# Patient Record
Sex: Male | Born: 1990 | Race: White | Hispanic: No | Marital: Married | Smoking: Never smoker
Health system: Southern US, Community
[De-identification: ages and names within clinical notes are randomized; demographics above are authoritative.]

## PROBLEM LIST (undated history)

## (undated) DIAGNOSIS — Z8679 Personal history of other diseases of the circulatory system: Secondary | ICD-10-CM

## (undated) DIAGNOSIS — Z8639 Personal history of other endocrine, nutritional and metabolic disease: Secondary | ICD-10-CM

## (undated) HISTORY — DX: Personal history of other diseases of the circulatory system: Z86.79

## (undated) HISTORY — DX: Personal history of other endocrine, nutritional and metabolic disease: Z86.39

---

## 2014-10-10 ENCOUNTER — Ambulatory Visit (INDEPENDENT_AMBULATORY_CARE_PROVIDER_SITE_OTHER): Payer: PRIVATE HEALTH INSURANCE | Admitting: Physician Assistant

## 2014-10-10 VITALS — BP 116/76 | HR 73 | Temp 97.3°F | Resp 17 | Ht 75.5 in | Wt 190.4 lb

## 2014-10-10 DIAGNOSIS — J309 Allergic rhinitis, unspecified: Secondary | ICD-10-CM

## 2014-10-10 DIAGNOSIS — R05 Cough: Secondary | ICD-10-CM | POA: Diagnosis not present

## 2014-10-10 DIAGNOSIS — R0981 Nasal congestion: Secondary | ICD-10-CM | POA: Diagnosis not present

## 2014-10-10 DIAGNOSIS — R059 Cough, unspecified: Secondary | ICD-10-CM

## 2014-10-10 MED ORDER — AMOXICILLIN-POT CLAVULANATE 875-125 MG PO TABS: 1.0000 | ORAL_TABLET | Freq: Two times a day (BID) | ORAL | Status: DC

## 2014-10-10 MED ORDER — HYDROCODONE-HOMATROPINE 5-1.5 MG/5ML PO SYRP
5.0000 mL | ORAL_SOLUTION | Freq: Three times a day (TID) | ORAL | Status: DC | PRN
Start: 2014-10-10 — End: 2017-09-11

## 2014-10-10 NOTE — Patient Instructions (Signed)
Since your symptoms have been ongoing for weeks and you continue to have the productive cough and runny nose we will treat you with antibiotics.  Please take the augmentin twice daily for 10 days.  Continue to take the mucinex-d as needed. If allergies are playing a component, daily flonase and claritin may help.  Please return to clinic if you're not feeling better in 5-7 days.  For the cough, please take the hycodan as needed.

## 2014-10-10 NOTE — Progress Notes (Signed)
   Subjective:    Patient ID: Logan Mcguire, male    DOB: 12/30/1990, 24 y.o.   MRN: [id number]  Chief Complaint  Patient presents with  . Coughing    Productive x 2 weeks  . Nasal Congestion    x 2 weeks   There are no active problems to display for this patient.  Prior to Admission medications   Medication Sig Start Date End Date Taking? Authorizing Provider  amoxicillin-clavulanate (AUGMENTIN) 875-125 MG per tablet Take 1 tablet by mouth 2 (two) times daily. 10/10/14   Raelyn Ensignodd Chassie Pennix, PA  HYDROcodone-homatropine [facility](HYCODAN) 5-1.5 MG/5ML syrup Take 5 mLs by mouth every 8 (eight) hours as needed for cough. 10/10/14   Raelyn Ensignodd Marquavious Nazar, PA  pseudoephedrine-guaifenesin (MUCINEX D) 60-600 MG per tablet Take 1 tablet by mouth every 12 (twelve) hours.   Yes Historical Provider, MD   Medications, allergies, past medical history, surgical history, family history, social history and problem list reviewed and updated.  HPI  [age] yom with no significant pmh presents with sinus congestion.  Sx started three wks ago with nasal/ear congestion. Had mild rhinorrhea. Approx one week ago sx worsened, rhinorrhea became more excessive and got yellow/green color. Has had mildly prod cough past week. Denies fever/chills.   Denies assoc st, abd pain, n/v, diarrhea.    Has hx seasonal allergies.   Review of Systems No cp, sob.     Objective:   Physical Exam  Constitutional: He appears well-developed and well-nourished.  Non-toxic appearance. He does not have a sickly appearance. He does not appear ill. No distress.  BP 116/76 mmHg  Pulse 73  Temp(Src) 97.3 F (36.3 C) (Oral)  Resp 17  Ht 6' 3.5" (1.918 m)  Wt 190 lb 6.4 oz (86.365 kg)  BMI 23.48 kg/m2  SpO2 99%   HENT:  Right Ear: A middle ear effusion is present.  Left Ear: A middle ear effusion is present.  Nose: Mucosal edema and rhinorrhea present. Right sinus exhibits frontal sinus tenderness. Right sinus exhibits no maxillary sinus tenderness. Left  sinus exhibits frontal sinus tenderness. Left sinus exhibits no maxillary sinus tenderness.  Mouth/Throat: Uvula is midline, oropharynx is clear and moist and mucous membranes are normal. No oropharyngeal exudate, posterior oropharyngeal edema, posterior oropharyngeal erythema or tonsillar abscesses.  Mild-mod bilateral frontal ttp.   Cardiovascular: Normal rate, regular rhythm and normal heart sounds.  Exam reveals no gallop.   No murmur heard. Pulmonary/Chest: Effort normal and breath sounds normal. No tachypnea. He has no decreased breath sounds. He has no wheezes. He has no rhonchi. He has no rales.  Lymphadenopathy:       Head (right side): No submental, no submandibular and no tonsillar adenopathy present.       Head (left side): No submental, no submandibular and no tonsillar adenopathy present.    He has no cervical adenopathy.      Assessment & Plan:   [age] yom with no significant pmh presents with sinus congestion.  Sinus congestion - Plan: amoxicillin-clavulanate (AUGMENTIN) 875-125 MG per tablet Cough - Plan: HYDROcodone-homatropine (HYCODAN) 5-1.5 MG/5ML syrup Allergic rhinitis, unspecified allergic rhinitis type --possibly bacterial etiology at this time as sx worsened last week --augmentin 10 days --mucinex-d prn, fluids/rest --claritin, flonase for allergies --rtc if not improved 5-7 days --hycodan prn for cough  Donnajean Lopesodd M. Stevana Dufner, PA-C Physician Assistant-Certified [facility]  10/10/2014 1:54 PM

## 2015-02-26 ENCOUNTER — Encounter: Payer: Self-pay | Admitting: *Deleted

## 2015-02-26 ENCOUNTER — Emergency Department
Admission: EM | Admit: 2015-02-26 | Discharge: 2015-02-27 | Disposition: A | Discharge status: Home / Self Care | Payer: PRIVATE HEALTH INSURANCE | Attending: Emergency Medicine | Admitting: Emergency Medicine

## 2015-02-26 DIAGNOSIS — W2209XA Striking against other stationary object, initial encounter: Secondary | ICD-10-CM | POA: Diagnosis not present

## 2015-02-26 DIAGNOSIS — Y998 Other external cause status: Secondary | ICD-10-CM | POA: Insufficient documentation

## 2015-02-26 DIAGNOSIS — Y9289 Other specified places as the place of occurrence of the external cause: Secondary | ICD-10-CM | POA: Insufficient documentation

## 2015-02-26 DIAGNOSIS — S81811A Laceration without foreign body, right lower leg, initial encounter: Secondary | ICD-10-CM | POA: Diagnosis present

## 2015-02-26 DIAGNOSIS — Z79899 Other long term (current) drug therapy: Secondary | ICD-10-CM | POA: Diagnosis not present

## 2015-02-26 DIAGNOSIS — IMO0002 Reserved for concepts with insufficient information to code with codable children: Secondary | ICD-10-CM

## 2015-02-26 DIAGNOSIS — Y9389 Activity, other specified: Secondary | ICD-10-CM | POA: Diagnosis not present

## 2015-02-26 MED ORDER — LIDOCAINE-EPINEPHRINE (PF) 1 %-1:200000 IJ SOLN
INTRAMUSCULAR | Status: AC
  Filled 2015-02-26: qty 30

## 2015-02-26 MED ORDER — LIDOCAINE-EPINEPHRINE (PF) 2 %-1:200000 IJ SOLN
10.0000 mL | Freq: Once | INTRAMUSCULAR | Status: AC
  Administered 2015-02-27: 10 mL via INTRADERMAL
  Filled 2015-02-26: qty 10

## 2015-02-26 NOTE — ED Notes (Signed)
Pt cut his left leg on the ledge of a bench in the pool around 11:30 this morning. Pt applied steri strips to the wound, reports still having bleeding to the area, wants further evaluation. Small amount of oozing noted in triage.

## 2015-02-27 MED ORDER — CEPHALEXIN 500 MG PO CAPS: 500.0000 mg | ORAL_CAPSULE | Freq: Three times a day (TID) | ORAL | Status: AC

## 2015-02-27 NOTE — Discharge Instructions (Signed)
Laceration Care, Adult A laceration is a cut that goes through all layers of the skin. The cut goes into the tissue beneath the skin. HOME CARE For stitches (sutures) or staples:  Keep the cut clean and dry.  If you have a bandage (dressing), change it at least once a day. Change the bandage if it gets wet or dirty, or as told by your doctor.  Wash the cut with soap and water 2 times a day. Rinse the cut with water. Pat it dry with a clean towel.  Put a thin layer of medicated cream on the cut as told by your doctor.  You may shower after the first 24 hours. Do not soak the cut in water until the stitches are removed.  Only take medicines as told by your doctor.  Have your stitches or staples removed as told by your doctor. For skin adhesive strips:  Keep the cut clean and dry.  Do not get the strips wet. You may take a bath, but be careful to keep the cut dry.  If the cut gets wet, pat it dry with a clean towel.  The strips will fall off on their own. Do not remove the strips that are still stuck to the cut. For wound glue:  You may shower or take baths. Do not soak or scrub the cut. Do not swim. Avoid heavy sweating until the glue falls off on its own. After a shower or bath, pat the cut dry with a clean towel.  Do not put medicine on your cut until the glue falls off.  If you have a bandage, do not put tape over the glue.  Avoid lots of sunlight or tanning lamps until the glue falls off. Put sunscreen on the cut for the first year to reduce your scar.  The glue will fall off on its own. Do not pick at the glue. You may need a tetanus shot if:  You cannot remember when you had your last tetanus shot.  You have never had a tetanus shot. If you need a tetanus shot and you choose not to have one, you may get tetanus. Sickness from tetanus can be serious. GET HELP RIGHT AWAY IF:   Your pain does not get better with medicine.  Your arm, hand, leg, or foot loses feeling  (numbness) or changes color.  Your cut is bleeding.  Your joint feels weak, or you cannot use your joint.  You have painful lumps on your body.  Your cut is red, puffy (swollen), or painful.  You have a red line on the skin near the cut.  You have yellowish-white fluid (pus) coming from the cut.  You have a fever.  You have a bad smell coming from the cut or bandage.  Your cut breaks open before or after stitches are removed.  You notice something coming out of the cut, such as wood or glass.  You cannot move a finger or toe. MAKE SURE YOU:   Understand these instructions.  Will watch your condition.  Will get help right away if you are not doing well or get worse. Document Released: 01/18/2008 Document Revised: 10/24/2011 Document Reviewed: 01/25/2011 [facility] Patient Information 2015 [employer] [place]. This information is not intended to replace advice given to you by your health care provider. Make sure you discuss any questions you have with your health care provider.  Take antibiotic as directed. Watch for signs of infection. Return in about 10-12 days for suture removal.

## 2015-02-27 NOTE — ED Provider Notes (Signed)
[facility] Emergency Department Provider Note  ____________________________________________  Time seen: Approximately 12:09 AM  I have reviewed the triage vital signs and the nursing notes.   HISTORY  Chief Complaint Extremity Laceration    HPI Logan Mcguire is a 24 y.o. male who injured his left shin approximately 10-12 hours ago. He hit a bench. Also injured his right shin. Attempted closure with butterfly strips. However area continued to bleed. He reports a tetanus shot within the last 5 years.   History reviewed. No pertinent past medical history.  There are no active problems to display for this patient.   History reviewed. No pertinent past surgical history.  Current Outpatient Rx  Name  Route  Sig  Dispense  Refill  . cephALEXin (KEFLEX) 500 MG capsule   Oral   Take 1 capsule (500 mg total) by mouth 3 (three) times daily.   9 capsule   0   . HYDROcodone-homatropine (HYCODAN) 5-1.5 MG/5ML syrup   Oral   Take 5 mLs by mouth every 8 (eight) hours as needed for cough.   120 mL   0   . pseudoephedrine-guaifenesin (MUCINEX D) 60-600 MG per tablet   Oral   Take 1 tablet by mouth every 12 (twelve) hours.           Allergies Review of patient's allergies indicates no known allergies.  No family history on file.  Social History History  Substance Use Topics  . Smoking status: Never Smoker   . Smokeless tobacco: Not on file  . Alcohol Use: 0.0 oz/week    0 Standard drinks or equivalent per week    Review of Systems Constitutional: No fever/chills Eyes: No visual changes. ENT: No sore throat. Cardiovascular: Denies chest pain. Respiratory: Denies shortness of breath. Gastrointestinal: No abdominal pain.  No nausea, no vomiting.  No diarrhea.  No constipation. Genitourinary: Negative for dysuria. Musculoskeletal: Negative for back pain. Skin: Negative for rash. Neurological: Negative for headaches, focal weakness or  numbness.  10-point ROS otherwise negative.  ____________________________________________   PHYSICAL EXAM:  VITAL SIGNS: ED Triage Vitals  Enc Vitals Group     BP 02/26/15 2245 136/49 mmHg     Pulse Rate 02/26/15 2245 89     Resp 02/26/15 2245 16     Temp 02/26/15 2245 97.6 F (36.4 C)     Temp Source 02/26/15 2245 Oral     SpO2 02/26/15 2245 100 %     Weight 02/26/15 2245 190 lb (86.183 kg)     Height 02/26/15 2245 6\' 4"  (1.93 m)     Head Cir --      Peak Flow --      Pain Score 02/26/15 2246 3     Pain Loc --      Pain Edu? --      Excl. in GC? --     Constitutional: Alert and oriented. Well appearing and in no acute distress. Eyes: Conjunctivae are normal. PERRL. EOMI. Head: Atraumatic. Nose: No congestion/rhinnorhea. Mouth/Throat: Mucous membranes are moist.  Oropharynx non-erythematous. Cardiovascular: Normal rate, regular rhythm. Grossly normal heart sounds.  Good peripheral circulation. Respiratory: Normal respiratory effort.  No retractions. Lungs CTAB. Gastrointestinal: Soft and nontender. No distention. No abdominal bruits. No CVA tenderness. Musculoskeletal: No lower extremity tenderness nor edema.  No joint effusions. Neurologic:  Normal speech and language. No gross focal neurologic deficits are appreciated. No gait instability. Skin:  Skin is warm, dry and intact. No rash noted. Psychiatric: Mood and affect are  normal. Speech and behavior are normal.  ____________________________________________   LABS (all labs ordered are listed, but only abnormal results are displayed)  Labs Reviewed - No data to display ____________________________________________  EKG   ____________________________________________  RADIOLOGY   ____________________________________________   PROCEDURES  Procedure(s) performed: LACERATION REPAIR Performed by: Ignacia Bayley Authorized by: Ignacia Bayley Consent: Verbal consent obtained. Risks and benefits: risks, benefits  and alternatives were discussed Consent given by: patient Patient identity confirmed: provided demographic data Prepped and Draped in normal sterile fashion Wound explored  Laceration Location: left shin, superior wound  Laceration Length: 2cm  No Foreign Bodies seen or palpated  Anesthesia: local infiltration  Local anesthetic: lidocaine 1%   Anesthetic total: 2 ml  Irrigation method: syringe Amount of cleaning: standard  Skin closure: complete  Number of sutures: 4    5-0 nylon  Technique: simple interrupted   Patient tolerance: Patient tolerated the procedure well with no immediate complications.    LACERATION REPAIR Performed by: Ignacia Bayley Authorized by: Ignacia Bayley Consent: Verbal consent obtained. Risks and benefits: risks, benefits and alternatives were discussed Consent given by: patient Patient identity confirmed: provided demographic data Prepped and Draped in normal sterile fashion Wound explored  Laceration Location: left shin, inferior wound  Laceration Length: 1.5cm  No Foreign Bodies seen or palpated  Anesthesia: local infiltration  Local anesthetic: lidocaine 1%    Anesthetic total: 2 ml  Irrigation method: syringe Amount of cleaning: standard  Skin closure: complete  Number of sutures: 5   5-0 nylon  Technique: simple interrupted  Patient tolerance: Patient tolerated the procedure well with no immediate complications.   LACERATION REPAIR Performed by: Ignacia Bayley Authorized by: Ignacia Bayley Consent: Verbal consent obtained. Risks and benefits: risks, benefits and alternatives were discussed Consent given by: patient Patient identity confirmed: provided demographic data Prepped and Draped in normal sterile fashion Wound explored  Laceration Location: right shin  Laceration Length: 1cm  No Foreign Bodies seen or palpated  Anesthesia: local infiltration  Local anesthetic: lidocaine 1% and epinephrine  Anesthetic  total: 0 ml  Irrigation method: syringe Amount of cleaning: standard  Skin closure: complete  Number of sutures: 1    5-0 nylon  Technique: simple interrupted.  Patient tolerance: Patient tolerated the procedure well with no immediate complications.   Critical Care performed: No  ____________________________________________   INITIAL IMPRESSION / ASSESSMENT AND PLAN / ED COURSE  Pertinent labs & imaging results that were available during my care of the patient were reviewed by me and considered in my medical decision making (see chart for details).  24 year old male who injured his left and right shins while jumping onto a bench. 3 lacerations repaired as above. Patient tolerated well. Tetanus is up-to-date. Given 3 days of Keflex for prophylaxis, as one wound was close to the bone. He will return in 10-12 days for suture removal, or sooner for any concerns. ____________________________________________   FINAL CLINICAL IMPRESSION(S) / ED DIAGNOSES  Final diagnoses:  Laceration       Ignacia Bayley, PA-C 02/27/15 0017  Myrna Blazer, MD 03/03/15 2207

## 2016-12-13 DIAGNOSIS — R079 Chest pain, unspecified: Secondary | ICD-10-CM | POA: Diagnosis not present

## 2016-12-15 DIAGNOSIS — R079 Chest pain, unspecified: Secondary | ICD-10-CM | POA: Diagnosis not present

## 2016-12-21 ENCOUNTER — Ambulatory Visit: Payer: PRIVATE HEALTH INSURANCE | Admitting: Internal Medicine

## 2016-12-27 DIAGNOSIS — R079 Chest pain, unspecified: Secondary | ICD-10-CM | POA: Diagnosis not present

## 2017-09-05 ENCOUNTER — Ambulatory Visit: Payer: PRIVATE HEALTH INSURANCE | Admitting: Osteopathic Medicine

## 2017-09-11 ENCOUNTER — Encounter: Payer: Self-pay | Admitting: Osteopathic Medicine

## 2017-09-11 ENCOUNTER — Encounter (INDEPENDENT_AMBULATORY_CARE_PROVIDER_SITE_OTHER): Payer: Self-pay

## 2017-09-11 ENCOUNTER — Ambulatory Visit (INDEPENDENT_AMBULATORY_CARE_PROVIDER_SITE_OTHER): Payer: No Typology Code available for payment source | Admitting: Osteopathic Medicine

## 2017-09-11 VITALS — BP 130/87 | HR 80 | Temp 98.1°F | Ht 76.0 in | Wt 214.0 lb

## 2017-09-11 DIAGNOSIS — R1032 Left lower quadrant pain: Secondary | ICD-10-CM

## 2017-09-11 DIAGNOSIS — M25562 Pain in left knee: Secondary | ICD-10-CM | POA: Diagnosis not present

## 2017-09-11 MED ORDER — DICLOFENAC SODIUM 1 % TD GEL: 2.0000 g | Freq: Four times a day (QID) | TRANSDERMAL | 11 refills | Status: DC

## 2017-09-11 NOTE — Patient Instructions (Addendum)
Knee:  Xray today to look for weird stuff  Voltaren gel as needed for pain  Physical therapy  If knee is not better or if it gets worse, I would recommend follow-up with one of our sports medicine specialists (Dr Denyse Amassorey or Dr. Cherylann Parrhekkekandam aka Dr. Karie Schwalbe) for further evaluation in 2-4 weeks. Just call our office and ask for an appointment for sports medicine!   Lower abdominal pain:  This sounds more like muscle strain to me, possible irritable bowel   Possible intestinal irritation, though this usually will have diarrhea or constipation issues  Possible prostate issue, though this usually will have urinary sypmtoms

## 2017-09-11 NOTE — Progress Notes (Signed)
HPI: Logan Mcguire is a 27 y.o. male who  has a past medical history of History of high blood pressure and History of high cholesterol.  he presents to [facility] today, 09/11/17,  for chief complaint of: New to Establish Knee pain L L pelvic pain  Knee . Context: Athletic patient, problem with the knee in the past. He does a lot of running, soccer. . Location:above the patella . Quality:grinding-type feeling . Duration: few months, worse past few weeks . Modifying factors: worse with standing, going up steps; over-the-counter pain medication somewhat helpful  Pelvic/Abdominal  . Location: centers/left lower abdomen/pelvis . Quality:cramping type somewhat dull . Duration:few months . Timing:intermittent, nothing really seems to trigger it, cannot associate with any factors such as eating, . Modifying factors: typically feels a bit worse before a bowel movement and then better.Is having some loose stool but no significant diarrhea, no blood in the stool . Assoc signs/symptoms: no groin or testicular pain      Past medical, surgical, social and family history reviewed:  There are no active problems to display for this patient.  History reviewed. No pertinent surgical history.  Social History   Tobacco Use  . Smoking status: Never Smoker  . Smokeless tobacco: Never Used  Substance Use Topics  . Alcohol use: Yes    Alcohol/week: 0.0 oz    Comment: Socially    Family History  Problem Relation Age of Onset  . Heart attack Maternal Grandmother   . Diabetes Maternal Grandmother   . High blood pressure Maternal Grandmother   . High Cholesterol Maternal Grandmother   . Heart attack Maternal Grandfather   . Diabetes Maternal Grandfather   . High blood pressure Maternal Grandfather   . High Cholesterol Maternal Grandfather   . Lung cancer Paternal Grandfather   . Prostate cancer Paternal Grandfather      Current medication list and  allergy/intolerance information reviewed:    Current Outpatient Medications  Medication Sig Dispense Refill  . HYDROcodone-homatropine (HYCODAN) 5-1.5 MG/5ML syrup Take 5 mLs by mouth every 8 (eight) hours as needed for cough. (Patient not taking: Reported on 09/11/2017) 120 mL 0  . pseudoephedrine-guaifenesin (MUCINEX D) 60-600 MG per tablet Take 1 tablet by mouth every 12 (twelve) hours.     No current facility-administered medications for this visit.     No Known Allergies    Review of Systems:  Constitutional:  No  fever, no chills, No recent illness, No unintentional weight changes. No significant fatigue.   HEENT: No  headache, no vision change, no hearing change, No sore throat, No  sinus pressure  Cardiac: No  chest pain, No  pressure, No palpitations, No  Orthopnea  Respiratory:  No  shortness of breath. No  Cough  Gastrointestinal: No  abdominal pain, No  nausea, No  vomiting,  No  blood in stool, No  diarrhea, No  constipation   Musculoskeletal: +myalgia/arthralgia  Skin: No  Rash, No other wounds/concerning lesions  Genitourinary: No  incontinence, No  abnormal genital bleeding, No abnormal genital discharge  Hem/Onc: No  easy bruising/bleeding, No  abnormal lymph node  Endocrine: No cold intolerance,  No heat intolerance. No polyuria/polydipsia/polyphagia   Neurologic: No  weakness, No  dizziness, No  slurred speech/focal weakness/facial droop  Psychiatric: No  concerns with depression, No  concerns with anxiety, No sleep problems, No mood problems  Exam:  BP 130/87 (BP Location: Left Arm)   Pulse 80   Temp 98.1 F (  36.7 C) (Oral)   Ht 6\' 4"  (1.93 m)   Wt 214 lb (97.1 kg)   BMI 26.05 kg/m   Constitutional: VS see above. General Appearance: alert, well-developed, well-nourished, NAD  Eyes: Normal lids and conjunctive, non-icteric sclera  Ears, Nose, Mouth, Throat: MMM, Normal external inspection ears/nares/mouth/lips/gums.   Neck: No masses, trachea  midline. No thyroid enlargement. No tenderness/mass appreciated. No lymphadenopathy  Respiratory: Normal respiratory effort. no wheeze, no rhonchi, no rales  Cardiovascular: S1/S2 normal, no murmur, no rub/gallop auscultated. RRR. No lower extremity edema.   Gastrointestinal: Nontender, no masses. No hepatomegaly, no splenomegaly. No hernia appreciated. Bowel sounds normal. Rectal exam deferred.   Musculoskeletal: Gait normal. No clubbing/cyanosis of digits.   Left knee: Normal patellar glide, symmetric to right. Negative McMurrays medial/lateral, ,anterior cruciate ligament, PCL, medial and lateral collateral ligaments intact. No crepitus.  Neurological: Normal balance/coordination. No tremor. No cranial nerve deficit on limited exam. Motor and sensation intact and symmetric. Cerebellar reflexes intact.   Skin: warm, dry, intact. No rash/ulcer. No concerning nevi or subq nodules on limited exam.    Psychiatric: Normal judgment/insight. Normal mood and affect. Oriented x3.       ASSESSMENT/PLAN:   Left knee pain, unspecified chronicity - advised x-ray in formal physical therapy, trial Voltaren gel, sports med follow-up as needed. had a soccer game to get to, will come back for x-ray - Plan: DG Knee Complete 4 Views Left, Ambulatory referral to Physical Therapy  Left lower quadrant abdominal pain of unknown etiology - possible muscle strain though he mentionsloose stool on occasion as well, declines CT at this time    Patient Instructions  Knee:  Xray today to look for weird stuff  Voltaren gel as needed for pain  Physical therapy  If knee is not better or if it gets worse, I would recommend follow-up with one of our sports medicine specialists (Dr Denyse Amassorey or Dr. Cherylann Parrhekkekandam aka Dr. Karie Schwalbe) for further evaluation in 2-4 weeks. Just call our office and ask for an appointment for sports medicine!   Lower abdominal pain:  This sounds more like muscle strain to me, possible irritable  bowel   Possible intestinal irritation, though this usually will have diarrhea or constipation issues  Possible prostate issue, though this usually will have urinary sypmtoms         Visit summary with medication list and pertinent instructions was printed for patient to review. All questions at time of visit were answered - patient instructed to contact office with any additional concerns. ER/RTC precautions were reviewed with the patient.   Follow-up plan: Return for ANNUAL PHYSICAL AND FASTING LABS SOMETIME THIS YEAR - sooner if needed for sports visit.    Please note: voice recognition software was used to produce this document, and typos may escape review. Please contact Dr. Lyn HollingsheadAlexander for any needed clarifications.

## 2017-09-12 ENCOUNTER — Ambulatory Visit: Payer: No Typology Code available for payment source

## 2017-09-13 ENCOUNTER — Encounter: Payer: Self-pay | Admitting: Osteopathic Medicine

## 2017-09-13 DIAGNOSIS — R1032 Left lower quadrant pain: Secondary | ICD-10-CM | POA: Insufficient documentation

## 2017-09-13 DIAGNOSIS — M25562 Pain in left knee: Secondary | ICD-10-CM | POA: Insufficient documentation

## 2017-09-15 ENCOUNTER — Telehealth: Payer: Self-pay

## 2017-09-15 NOTE — Telephone Encounter (Signed)
As per pt, currently has [employer] insurance & will require a referral for prior to starting physical therapy. Pls advise, thanks.

## 2017-09-18 NOTE — Telephone Encounter (Signed)
Pt advised.

## 2017-09-18 NOTE — Telephone Encounter (Signed)
I already placed a referral order at his last visit. He just needs to contact [facility] prior to his first appointment with physical therapy

## 2017-09-20 ENCOUNTER — Ambulatory Visit: Payer: No Typology Code available for payment source | Admitting: Rehabilitative and Restorative Service Providers"

## 2017-09-20 DIAGNOSIS — M25562 Pain in left knee: Secondary | ICD-10-CM | POA: Diagnosis not present

## 2017-09-20 DIAGNOSIS — R29898 Other symptoms and signs involving the musculoskeletal system: Secondary | ICD-10-CM

## 2017-09-20 NOTE — Patient Instructions (Addendum)
HIP: Hamstrings - Supine  Place strap around foot. Raise leg up, keeping knee straight.  Bend opposite knee to protect back if indicated. Hold 30 seconds. 3 reps per set, 2-3 sets per day  Outer Hip Stretch: Reclined IT Band Stretch (Strap)   Strap around one foot, pull leg across body until you feel a pull or stretch in the outside of your hip, with shoulders on mat. Hold for 30 seconds. Repeat 3 times each leg. 2-3 times/day.  Piriformis Stretch   Lying on back, pull right knee toward opposite shoulder. Hold 30 seconds. Repeat 3 times. Do 2-3 sessions per day.   Calf Stretch    Place hands on wall at shoulder height. Keeping back leg straight, bend front leg, feet pointing forward, heels flat on floor. Lean forward slightly until stretch is felt in calf of back leg. Hold stretch ___ seconds, breathing slowly in and out. Repeat stretch with other leg back. Do ___ sessions per day. Variation: Use chair or table for support.  Quads / HF, Supine   Lie near edge of bed, pull both knees up toward chest. Hold one knee as you drop the other leg off the edge of the bed.  Relax hanging knee/can bend knee back if indicated. Hold 30 seconds. Repeat 3 times per session. Do 2-3 sessions per day.  Quads / HF, Prone KNEE: Quadriceps - Prone    Place strap around ankle. Bring ankle toward buttocks. Press hip into surface. Hold 30 seconds. Repeat 3 times per session. Do 2-3 sessions per day.  Trigger Point Dry Needling  . What is Trigger Point Dry Needling (DN)? o DN is a physical therapy technique used to treat muscle pain and dysfunction. Specifically, DN helps deactivate muscle trigger points (muscle knots).  o A thin filiform needle is used to penetrate the skin and stimulate the underlying trigger point. The goal is for a local twitch response (LTR) to occur and for the trigger point to relax. No medication of any kind is injected during the procedure.   . What Does Trigger Point  Dry Needling Feel Like?  o The procedure feels different for each individual patient. Some patients report that they do not actually feel the needle enter the skin and overall the process is not painful. Very mild bleeding may occur. However, many patients feel a deep cramping in the muscle in which the needle was inserted. This is the local twitch response.   Marland Kitchen How Will I feel after the treatment? o Soreness is normal, and the onset of soreness may not occur for a few hours. Typically this soreness does not last longer than two days.  o Bruising is uncommon, however; ice can be used to decrease any possible bruising.  o In rare cases feeling tired or nauseous after the treatment is normal. In addition, your symptoms may get worse before they get better, this period will typically not last longer than 24 hours.   . What Can I do After My Treatment? o Increase your hydration by drinking more water for the next 24 hours. o You may place ice or heat on the areas treated that have become sore, however, do not use heat on inflamed or bruised areas. Heat often brings more relief post needling. o You can continue your regular activities, but vigorous activity is not recommended initially after the treatment for 24 hours. o DN is best combined with other physical therapy such as strengthening, stretching, and other therapies.    Scapula  Adduction With Pectoralis Stretch: Low - Standing   Shoulders at 45 hands even with shoulders, keeping weight through legs, shift weight forward until you feel pull or stretch through the front of your chest. Hold _30__ seconds. Do _3__ times, _2-4__ times per day.   Scapula Adduction With Pectoralis Stretch: Mid-Range - Standing   Shoulders at 90 elbows even with shoulders, keeping weight through legs, shift weight forward until you feel pull or strength through the front of your chest. Hold __30_ seconds. Do _3__ times, __2-4_ times per day.   Scapula  Adduction With Pectoralis Stretch: High - Standing   Shoulders at 120 hands up high on the doorway, keeping weight on feet, shift weight forward until you feel pull or stretch through the front of your chest. Hold _30__ seconds. Do _3__ times, _2-3__ times per day.

## 2017-09-20 NOTE — Therapy (Signed)
[facility] [place] [address] [place], [place], [address] Phone: [phone]   Fax:  [phone]  Physical Therapy Evaluation  Patient Details  Name: Logan Mcguire MRN: [id number] Date of Birth: [birth year] Referring Provider: Dr Lyn Hollingshead    Encounter Date: 09/20/2017  PT End of Session - 09/20/17 0713    Visit Number  1    Number of Visits  6    Date for PT Re-Evaluation  11/01/17    PT Start Time  0713    PT Stop Time  0810    PT Time Calculation (min)  57 min    Activity Tolerance  Patient tolerated treatment well       Past Medical History:  Diagnosis Date  . History of high blood pressure   . History of high cholesterol     No past surgical history on file.  There were no vitals filed for this visit.   Subjective Assessment - 09/20/17 0720    Subjective  Patient reports that he was doing a squat when lifting when he noticed pain in the Lt knee. Pain persisted when he squats. He has pain with sit to stand; prolonged walking and stairs.     Pertinent History  denies any prior knee pain; has had some shoulder pain treated with PT inc DN with improvement; Rt ankle sprain ~ 6 yrs with continued limited ROM Rt ankle; shoulder subluxation Rt 2015     How long can you sit comfortably?  no limit    How long can you stand comfortably?  60 min or more     How long can you walk comfortably?  45 min     Diagnostic tests  xray (-)     Patient Stated Goals  to be able to squat without pain     Currently in Pain?  No/denies    Pain Location  Knee    Pain Orientation  Left    Pain Descriptors / Indicators  Sharp;Nagging    Pain Type  Acute pain    Pain Radiating Towards  up into thigh in front     Pain Onset  More than a month ago    Pain Frequency  Intermittent    Aggravating Factors   squatting; prolonged walking; pushing foot into shoe; moving sit to stand; down stairs    Pain Relieving Factors  rest          [facility] PT  Assessment - 09/20/17 0001      Assessment   Medical Diagnosis  Lt knee pain     Referring Provider  Dr Lyn Hollingshead     Onset Date/Surgical Date  07/09/17    Hand Dominance  Right    Next MD Visit  PRN     Prior Therapy  for shoulder none for knee       Precautions   Precautions  None      Balance Screen   Has the patient fallen in the past 6 months  No    Has the patient had a decrease in activity level because of a fear of falling?   No    Is the patient reluctant to leave their home because of a fear of falling?   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     Leisure  cross fit 6 days/wk; indoor rec soccer 1x/wk; mountain bike; yard work; some household chores       Observation/Other Assessments   Focus on Therapeutic Outcomes (FOTO)   50% limitation       Sensation   Additional Comments  WNL's per pt report       Posture/Postural Control   Posture Comments  head forward; shoulders rounded and elevated; scapulae abducted and rotated along the thoracic wall; LE's in ER       AROM   Right/Left Hip  -- tight Rt hip end ranges; Lt hip ext/abd    Right/Left Knee  -- WNL's pain free     Right/Left Ankle  -- limited Rt ankle DF       Strength   Right/Left Hip  -- 5/5 bilat hips     Right/Left Knee  -- 5/5 bilat - painfree     Right/Left Ankle  -- 5/5 painfree       Flexibility   Hamstrings  tight Rt 70 deg; Lt 75 deg     Quadriceps  tight Rt heel 6 inches from buttocks; Lt 8 inches     ITB  tight bilat Lt > Rt     Piriformis  tight bilat Lt > Rt       Palpation   Patella mobility  tightness with medial glide Lt     Palpation comment  muscular tightness through distal Lt quad; tight  lateral patella with banding through lateral quads at patella       Special Tests   Other special tests  (+) patella grind Lt       Ambulation/Gait   Gait Comments  ambulates with LE's in ER              Objective measurements completed on examination: See above findings.      [facility] Adult PT Treatment/Exercise - 09/20/17 0001      Therapeutic Activites    Therapeutic Activities  -- instructed in patellar mobs/transverse friction lat quad Lt       Knee/Hip Exercises: Stretches   Passive Hamstring Stretch  Left;Right;1 rep;30 seconds supine with strap     Quad Stretch  Left;Right;1 rep;30 seconds prone with strap     Hip Flexor Stretch  Left;Right;1 rep;30 seconds supine dropping on knee off table     ITB Stretch  Left;Right;1 rep;30 seconds supine with strap     Piriformis Stretch  Left;Right;1 rep;30 seconds supine travell varying angles     Gastroc Stretch  Left;Right;1 rep;30 seconds standing      Moist Heat Therapy   Number Minutes Moist Heat  12 Minutes    Moist Heat Location  Knee Lt quad       Manual Therapy   Manual therapy comments  pt supine     Joint Mobilization  patellar mobs     Soft tissue mobilization  distal Lt quads       Trigger Point Dry Needling - 09/20/17 0851    Consent Given?  Yes    Education Handout Provided  Yes    Muscles Treated Lower Body  -- Lt with estim     Quadriceps Response  Palpable increased muscle length;Twitch response elicited           PT Education -  09/20/17 0750    Education provided  Yes    Education Details  HEP DN    Person(s) Educated  Patient    Methods  Explanation;Demonstration;Tactile cues;Verbal cues;Handout    Comprehension  Verbalized understanding;Returned demonstration;Verbal cues required;Tactile cues required          PT Long Term Goals - 09/20/17 0917      PT LONG TERM GOAL #1   Title  Improve joint mobility and ROM bilat hips and Rt ankle DF to allow improved movement patterns 11/01/17     Time  6    Period  Weeks    Status  New      PT LONG TERM GOAL #2   Title  Decrease Lt knee pain with functional activities by 75-80% including squatting, prolonged walking (> than 45 min); descending stairs 11/01/17    Time  6    Period  Weeks    Status  New      PT LONG TERM GOAL #3   Title  Decrease banding distal quad to palpation Lt LE 11/01/17    Time  6    Period  Weeks    Status  New      PT LONG TERM GOAL #4   Title  Independent in HEP 11/01/17    Time  6    Period  Weeks    Status  New      PT LONG TERM GOAL #5   Title  Improve FOTO to </= 26% limitation 11/01/17    Time  6    Period  Weeks    Status  New             Plan - 09/20/17 0858    Clinical Impression Statement  Logan Mcguire presents with 2-3 month history of Lt knee pain following sudden onset of pain while he was squatting during a lift. He has continued to have pain with squatting, prolonged walking, descending stiars. Upon evaluation, patient has end range tightness bilat LE;s; poor patellar tracking Lt; muscular tightness and banding in distal quad on Lt. He will benefit from PT to address muscular imbalance and improve patellar mechanics.     Clinical Presentation  Stable    Clinical Decision Making  Low    Rehab Potential  Good    PT Frequency  1x / week    PT Duration  6 weeks    PT Treatment/Interventions  Patient/family education;ADLs/Self Care Home Management;Cryotherapy;Electrical Stimulation;Iontophoresis 4mg /ml Dexamethasone;Moist Heat;Ultrasound;Dry needling;Manual techniques;Neuromuscular re-education;Therapeutic activities;Therapeutic exercise;Taping    PT Next Visit Plan  review HEP; assess response to DN; add hip flexor stretch in sitting; deep tissue work v DN Lt quad; modalities and taping as indicated     Consulted and Agree with Plan of Care  Patient       Patient will benefit from skilled therapeutic intervention in order to improve the following deficits and impairments:  Postural  dysfunction, Improper body mechanics, Increased muscle spasms, Increased fascial restricitons, Decreased mobility, Decreased range of motion, Decreased activity tolerance  Visit Diagnosis: Acute pain of left knee - Plan: PT plan of care cert/re-cert  Other symptoms and signs involving the musculoskeletal system - Plan: PT plan of care cert/re-cert     Problem List Patient Active Problem List   Diagnosis Date Noted  . Left knee pain 09/13/2017  . Left lower quadrant abdominal pain of unknown etiology 09/13/2017    Jamiesha Victoria Rober Minion PT, MPH  09/20/2017, 9:23 AM  [facility]  [address] [place] [address] [place], [place], [address] Phone: [phone]   Fax:  [phone]  Name: Logan Mcguire MRN: [id number] Date of Birth: [birth year]

## 2017-09-29 ENCOUNTER — Ambulatory Visit: Payer: No Typology Code available for payment source | Admitting: Physical Therapy

## 2017-09-29 DIAGNOSIS — M25562 Pain in left knee: Secondary | ICD-10-CM | POA: Diagnosis not present

## 2017-09-29 DIAGNOSIS — R29898 Other symptoms and signs involving the musculoskeletal system: Secondary | ICD-10-CM | POA: Diagnosis not present

## 2017-09-29 NOTE — Therapy (Signed)
[facility] [place] [address] [place] [address] [place], [place], [address] Phone: [phone]   Fax:  [phone]  Physical Therapy Treatment  Patient Details  Name: Logan Mcguire MRN: [id number] Date of Birth: 1991/03/23 Referring Provider: Dr. Lyn Hollingshead   Encounter Date: 09/29/2017  PT End of Session - 09/29/17 0807    Visit Number  2    Number of Visits  6    Date for PT Re-Evaluation  11/01/17    PT Start Time  0802    PT Stop Time  0901    PT Time Calculation (min)  59 min    Activity Tolerance  Patient tolerated treatment well    Behavior During Therapy  [facility] for tasks assessed/performed       Past Medical History:  Diagnosis Date  . History of high blood pressure   . History of high cholesterol     No past surgical history on file.  There were no vitals filed for this visit.  Subjective Assessment - 09/29/17 0807    Subjective  Pt Mcguire he has been holding off on squats and has been focusing on stretches.  Pain is still there in Lt knee.      Currently in Pain?  Yes    Pain Score  3     Pain Location  Knee    Pain Orientation  Left    Pain Descriptors / Indicators  Tightness;Sharp    Aggravating Factors   squatting    Pain Relieving Factors  rest, heat          [facility] PT Assessment - 09/29/17 0001      Assessment   Medical Diagnosis  Lt knee pain     Referring Provider  Dr. Lyn Hollingshead    Onset Date/Surgical Date  07/09/17    Hand Dominance  Right    Next MD Visit  PRN       Flexibility   Hamstrings  Rt 67, Lt 70    Quadriceps  tight Rt heel 3 inches from buttocks, 136 deg; Lt 4 inches, 134 deg        [facility] Adult PT Treatment/Exercise - 09/29/17 0001      Knee/Hip Exercises: Stretches   Passive Hamstring Stretch  Right;Left;30 seconds;4 reps seated and supine    Quad Stretch  Left;Right;2 reps;30 seconds    Hip Flexor Stretch  Left;Right;30 seconds;3 reps;Limitations    Hip Flexor Stretch Limitations  kneeling  and seated    ITB Stretch  Left;Right;2 reps;30 seconds    Piriformis Stretch  Left;Right;2 reps;30 seconds    Other Knee/Hip Stretches  trial of pigeon pose with modifications; encouraged other forms of stretches instead of this pose.       Modalities   Modalities  Electrical Stimulation;Moist Heat      Moist Heat Therapy   Number Minutes Moist Heat  15 Minutes    Moist Heat Location  Knee      Electrical Stimulation   Electrical Stimulation Location  above/below Lt patella, lateral quad     Electrical Stimulation Action  TENS    Electrical Stimulation Parameters  to tolerance    Electrical Stimulation Goals  Pain      Manual Therapy   Manual therapy comments  pt supine     Soft tissue mobilization  deep tissue work through the mid to distal quads medially and laterally        Trigger Point Dry Needling - 09/29/17 0947    Consent Given?  Yes    Muscles Treated Lower Body  -- Lt with estim     Tensor Fascia Lata Response  Palpable increased muscle length mid to distal ITB area     Quadriceps Response  Palpable increased muscle length;Twitch response elicited           PT Education - 09/29/17 1012    Education provided  Yes    Education Details  TENS info    Person(s) Educated  Patient    Methods  Explanation;Handout    Comprehension  Verbalized understanding          PT Long Term Goals - 09/29/17 1012      PT LONG TERM GOAL #1   Title  Improve joint mobility and ROM bilat hips and Rt ankle DF to allow improved movement patterns 11/01/17    Time  6    Period  Weeks    Status  On-going      PT LONG TERM GOAL #2   Title  Decrease Lt knee pain with functional activities by 75-80% including squatting, prolonged walking (> than 45 min); descending stairs 11/01/17    Time  6    Period  Weeks    Status  On-going      PT LONG TERM GOAL #3   Title  Decrease banding distal quad to palpation Lt LE 11/01/17    Time  6    Period  Weeks    Status  On-going      PT  LONG TERM GOAL #4   Title  Independent in HEP 11/01/17    Time  6    Period  Weeks    Status  On-going      PT LONG TERM GOAL #5   Title  Improve FOTO to </= 26% limitation 11/01/17    Time  6    Period  Weeks    Status  On-going            Plan - 09/29/17 0948    Clinical Impression Statement  Logan Mcguire improvement with DN at initial visit and demo improved quad flexibility. He is trying to work on stretching. Continued muscular tightness palpable Lt quad and ITB. Patient responded well to DN and estim with needling with some improvement in palpable tightness noted.     Rehab Potential  Good    PT Frequency  1x / week    PT Duration  6 weeks    PT Treatment/Interventions  Patient/family education;ADLs/Self Care Home Management;Cryotherapy;Electrical Stimulation;Iontophoresis 4mg /ml Dexamethasone;Moist Heat;Ultrasound;Dry needling;Manual techniques;Neuromuscular re-education;Therapeutic activities;Therapeutic exercise;Taping    PT Next Visit Plan  review HEP; assess response to DN; add hip flexor stretch in sitting; deep tissue work v DN Lt quad; modalities and taping as indicated     Consulted and Agree with Plan of Care  Patient       Patient will benefit from skilled therapeutic intervention in order to improve the following deficits and impairments:  Postural dysfunction, Improper body mechanics, Increased muscle spasms, Increased fascial restricitons, Decreased mobility, Decreased range of motion, Decreased activity tolerance  Visit Diagnosis: Acute pain of left knee  Other symptoms and signs involving the musculoskeletal system     Problem List Patient Active Problem List   Diagnosis Date Noted  . Left knee pain 09/13/2017  . Left lower quadrant abdominal pain of unknown etiology 09/13/2017   Logan Mcguire, PTA 09/29/17 10:14 AM  Celyn P. Leonor Liv PT, MPH 09/29/17 12:47 PM   [facility]  [address] [place] [address] [place], [place], [address] Phone: [phone]   Fax:  [phone]  Name: Logan Mcguire MRN: [id number] Date of Birth: 11-30-1990

## 2017-09-29 NOTE — Patient Instructions (Signed)
TENS UNIT  This is helpful for muscle pain and spasm.   Search and Purchase a TENS 7000 2nd edition  www.amazon.com  (It should be less than $30)     TENS unit instructions:   Do not shower or bathe with the unit on  Turn the unit off before removing electrodes or batteries  If the electrodes lose stickiness add a drop of water to the electrodes after they are disconnected from the unit and place on plastic sheet. If you continued to have difficulty, call the TENS unit company to purchase more electrodes.  Do not apply lotion on the skin area prior to use. Make sure the skin is clean and dry as this will help prolong the life of the electrodes.  After use, always check skin for unusual red areas, rash or other skin difficulties. If there are any skin problems, does not apply electrodes to the same area.  Never remove the electrodes from the unit by pulling the wires.  Do not use the TENS unit or electrodes other than as directed.  Do not change electrode placement without consulting your therapist or physician.  Keep 2 fingers with between each electrode.

## 2017-10-03 ENCOUNTER — Ambulatory Visit: Payer: No Typology Code available for payment source | Admitting: Rehabilitative and Restorative Service Providers"

## 2017-10-03 ENCOUNTER — Encounter: Payer: Self-pay | Admitting: Rehabilitative and Restorative Service Providers"

## 2017-10-03 DIAGNOSIS — M25562 Pain in left knee: Secondary | ICD-10-CM

## 2017-10-03 DIAGNOSIS — R29898 Other symptoms and signs involving the musculoskeletal system: Secondary | ICD-10-CM | POA: Diagnosis not present

## 2017-10-03 NOTE — Patient Instructions (Addendum)
Strengthening: Wall Slide    Leaning on wall, slowly lower buttocks until thighs are parallel to floor. Hold _3-5___ seconds. Tighten thigh muscles and return. Repeat __10__ times per set. Do __1-2__ sets per session. Do __1__ sessions per day.  Balance / Reach    Stand on left foot, Holding ____ pound weight in other hand. Bend knee, lowering body, and reach across. Hold _3-5___ seconds. Relax. Repeat __10__ times per set. Do __2-3__ sets per session. Do _1___ sessions per day.

## 2017-10-03 NOTE — Therapy (Signed)
[facility] [address] [place], [place], [address] Phone: [phone]   Fax:  [phone]  Physical Therapy Treatment  Patient Details  Name: Logan Mcguire MRN: [id number] Date of Birth: 1991-03-30 Referring Provider: Dr. Lyn HollingsheadAlexander   Encounter Date: 10/03/2017  PT End of Session - 10/03/17 0800    Visit Number  3    Number of Visits  6    Date for PT Re-Evaluation  11/01/17    PT Start Time  0800    PT Stop Time  0858    PT Time Calculation (min)  58 min    Activity Tolerance  Patient tolerated treatment well       Past Medical History:  Diagnosis Date  . History of high blood pressure   . History of high cholesterol     History reviewed. No pertinent surgical history.  There were no vitals filed for this visit.  Subjective Assessment - 10/03/17 0804    Subjective  Not much change in knee pain. Seems to have some increased pain after stretching.     Currently in Pain?  Yes    Pain Score  3     Pain Location  Knee    Pain Orientation  Left    Pain Descriptors / Indicators  Tightness;Sharp    Pain Type  Acute pain                      [facility] Adult PT Treatment/Exercise - 10/03/17 0001      Knee/Hip Exercises: Stretches   LobbyistQuad Stretch  Left;Right;2 reps;30 seconds added foam roll to distal quad for stretch       Knee/Hip Exercises: Aerobic   Stationary Bike  L3 x 5 min       Knee/Hip Exercises: Standing   Wall Squat  10 reps;3 seconds wall slide     SLS  Lt LE SLS with reach forward x 10      Moist Heat Therapy   Number Minutes Moist Heat  15 Minutes    Moist Heat Location  Knee      Electrical Stimulation   Electrical Stimulation Location  above/below Lt patella, lateral quad     Electrical Stimulation Action  IFC    Electrical Stimulation Parameters  to tolerance    Electrical Stimulation Goals  Pain;Tone      Manual Therapy   Manual therapy comments  pt supine     Joint  Mobilization  patellar mobs     Soft tissue mobilization  deep tissue work through the mid to distal quads medially and laterally        Trigger Point Dry Needling - 10/03/17 [id number]    Consent Given?  Yes    Muscles Treated Lower Body  -- Lt with estim     Tensor Fascia Lata Response  Palpable increased muscle length    Quadriceps Response  Palpable increased muscle length           PT Education - 10/03/17 0831    Education provided  Yes    Education Details  HEP     Person(s) Educated  Patient    Methods  Explanation;Demonstration;Tactile cues;Verbal cues;Handout    Comprehension  Verbalized understanding;Returned demonstration;Verbal cues required;Tactile cues required          PT Long Term Goals - 10/03/17 0801      PT LONG TERM GOAL #1   Title  Improve joint mobility and ROM bilat hips  and Rt ankle DF to allow improved movement patterns 11/01/17    Time  6    Period  Weeks    Status  On-going      PT LONG TERM GOAL #2   Title  Decrease Lt knee pain with functional activities by 75-80% including squatting, prolonged walking (> than 45 min); descending stairs 11/01/17    Time  6    Period  Weeks    Status  On-going      PT LONG TERM GOAL #3   Title  Decrease banding distal quad to palpation Lt LE 11/01/17    Time  6    Period  Weeks    Status  On-going      PT LONG TERM GOAL #4   Title  Independent in HEP 11/01/17    Time  6    Period  Weeks    Status  On-going      PT LONG TERM GOAL #5   Title  Improve FOTO to </= 26% limitation 11/01/17    Time  6    Period  Weeks    Status  On-going            Plan - 10/03/17 0806    Clinical Impression Statement  Continued pain in the Lt knee. Pain and tightness with palpation. Flexibility is increasing. Will increase focus on strengthening. Good respose to DN. No goals accomplished.     Rehab Potential  Good    PT Frequency  1x / week    PT Duration  6 weeks    PT Treatment/Interventions  Patient/family  education;ADLs/Self Care Home Management;Cryotherapy;Electrical Stimulation;Iontophoresis 4mg /ml Dexamethasone;Moist Heat;Ultrasound;Dry needling;Manual techniques;Neuromuscular re-education;Therapeutic activities;Therapeutic exercise;Taping    PT Next Visit Plan  review HEP; continue DN; add hip flexor stretch in sitting; deep tissue work v DN Lt quad; modalities and taping as indicated     Consulted and Agree with Plan of Care  Patient       Patient will benefit from skilled therapeutic intervention in order to improve the following deficits and impairments:  Postural dysfunction, Improper body mechanics, Increased muscle spasms, Increased fascial restricitons, Decreased mobility, Decreased range of motion, Decreased activity tolerance  Visit Diagnosis: Acute pain of left knee  Other symptoms and signs involving the musculoskeletal system     Problem List Patient Active Problem List   Diagnosis Date Noted  . Left knee pain 09/13/2017  . Left lower quadrant abdominal pain of unknown etiology 09/13/2017    Logan Mcguire PT, MPH  10/03/2017, 8:43 AM  [facility] [address] [place], [place], [address] Phone: [phone]   Fax:  [phone]  Name: Logan Mcguire MRN: [id number] Date of Birth: 1990-09-05

## 2017-10-13 ENCOUNTER — Ambulatory Visit: Payer: No Typology Code available for payment source | Admitting: Rehabilitative and Restorative Service Providers"

## 2017-10-13 ENCOUNTER — Encounter: Payer: Self-pay | Admitting: Rehabilitative and Restorative Service Providers"

## 2017-10-13 DIAGNOSIS — R29898 Other symptoms and signs involving the musculoskeletal system: Secondary | ICD-10-CM | POA: Diagnosis not present

## 2017-10-13 DIAGNOSIS — M25562 Pain in left knee: Secondary | ICD-10-CM | POA: Diagnosis not present

## 2017-10-13 NOTE — Therapy (Signed)
[facility] [address] [place], [place], [address] Phone: [phone]   Fax:  [phone]  Physical Therapy Treatment  Patient Details  Name: Logan Mcguire MRN: [id number] Date of Birth: October 01, 1990 Referring Provider: Dr Lyn Hollingshead   Encounter Date: 10/13/2017  PT End of Session - 10/13/17 0759    Visit Number  4    Number of Visits  6    Date for PT Re-Evaluation  11/01/17    PT Start Time  0758    PT Stop Time  0855    PT Time Calculation (min)  57 min    Activity Tolerance  Patient tolerated treatment well       Past Medical History:  Diagnosis Date  . History of high blood pressure   . History of high cholesterol     History reviewed. No pertinent surgical history.  There were no vitals filed for this visit.  Subjective Assessment - 10/13/17 0800    Subjective  No pain without pressure - has pain when using the leg. Some tightness and discomfort on Rt inside part of the knee. He went to a lifting class for 4 hours on 10/07/17 which irritated the knee.     Currently in Pain?  Yes    Pain Score  3     Pain Location  Knee    Pain Orientation  Left    Pain Descriptors / Indicators  Tightness;Sharp    Pain Type  Acute pain         [facility] PT Assessment - 10/13/17 0001      Assessment   Medical Diagnosis  Lt knee pain     Referring Provider  Dr Lyn Hollingshead    Onset Date/Surgical Date  07/09/17    Hand Dominance  Right    Next MD Visit  PRN       AROM   Right/Left Hip  -- tight Rt hip end ranges; tight Lt hip ext/abd     Right/Left Ankle  -- limited Rt ankle DF       Flexibility   Hamstrings  Rt 70 deg; Lt 75 deg     Quadriceps  tight Rt heel 3 inches from buttocks, 136 deg; Lt 4 inches, 134 deg     ITB  tight bilat Lt > Rt     Piriformis  tight bilat Lt > Rt       Palpation   Patella mobility  tightness with medial glide Lt     Palpation comment  muscular tightness through distal Lt quad; bandng through  lateral quads at patella; tightness through the Lt hip adductors; Rt hamstrings       Special Tests   Other special tests  (+) patella grind Lt       Ambulation/Gait   Gait Comments  ambulates with LE's in ER Rt > Lt                   [facility] Adult PT Treatment/Exercise - 10/13/17 0001      Knee/Hip Exercises: Stretches   Lobbyist  Left;Right;2 reps;30 seconds added foam roll to distal quad for stretch     Other Knee/Hip Stretches  hip flexor/quad stretch in half kneeling LE elevated on 12 in block with hips shifted fwd(captain Morgan stretch per pt) 30 sec x 2; 45 sec x 1     Other Knee/Hip Stretches  prostretch Rt ankle 45-60 sec x 3      Knee/Hip Exercises: Aerobic  Stationary Bike  L3 x 5 min       Knee/Hip Exercises: Standing   Wall Squat  10 reps;3 seconds wall slide     SLS  Lt LE SLS with reach forward x 10      Moist Heat Therapy   Number Minutes Moist Heat  15 Minutes    Moist Heat Location  Knee anterior thigh       Electrical Stimulation   Electrical Stimulation Location  above/below Lt patella, lateral quad     Electrical Stimulation Action  IFC    Electrical Stimulation Parameters  to tolerance    Electrical Stimulation Goals  Pain;Tone      Manual Therapy   Manual therapy comments  pt supine     Joint Mobilization  patellar mobs     Soft tissue mobilization  deep tissue work through the mid to distal quads medially and laterally        Trigger Point Dry Needling - 10/13/17 1355    Consent Given?  Yes    Muscles Treated Lower Body  -- Lt with estim     Tensor Fascia Lata Response  Palpable increased muscle length    Quadriceps Response  Palpable increased muscle length    Adductor Response  Palpable increased muscle length           PT Education - 10/13/17 0848    Education provided  Yes    Education Details  HEP     Person(s) Educated  Patient    Methods  Explanation;Demonstration;Tactile cues;Verbal cues;Handout    Comprehension   Verbalized understanding;Returned demonstration;Verbal cues required;Tactile cues required          PT Long Term Goals - 10/13/17 1405      PT LONG TERM GOAL #1   Title  Improve joint mobility and ROM bilat hips and Rt ankle DF to allow improved movement patterns 11/01/17    Period  Weeks    Status  On-going      PT LONG TERM GOAL #2   Title  Decrease Lt knee pain with functional activities by 75-80% including squatting, prolonged walking (> than 45 min); descending stairs 11/01/17    Time  6    Period  Weeks    Status  On-going      PT LONG TERM GOAL #3   Title  Decrease banding distal quad to palpation Lt LE 11/01/17    Time  6    Period  Weeks    Status  On-going      PT LONG TERM GOAL #4   Title  Independent in HEP 11/01/17    Time  6    Period  Weeks    Status  On-going      PT LONG TERM GOAL #5   Title  Improve FOTO to </= 26% limitation 11/01/17    Time  6    Period  Weeks    Status  On-going            Plan - 10/13/17 1402    Clinical Impression Statement  No significant change in Lt knee pain. Patient has postural and movement asymmetries. Quad flexibility Lt is increased. Patient feels that the DN helps. He is working on the stretching and selective strengthening at home and in the gym. Patient increased discomfort and tightness with lifting and squatting last week in a wt training class lasting 4 hours.     Rehab Potential  Good    PT Frequency  1x / week    PT Duration  6 weeks    PT Treatment/Interventions  Patient/family education;ADLs/Self Care Home Management;Cryotherapy;Electrical Stimulation;Iontophoresis 4mg /ml Dexamethasone;Moist Heat;Ultrasound;Dry needling;Manual techniques;Neuromuscular re-education;Therapeutic activities;Therapeutic exercise;Taping    PT Next Visit Plan  review HEP; continue DN; deep tissue work v DN Lt quad; modalities and taping as indicated     Consulted and Agree with Plan of Care  Patient       Patient will benefit from  skilled therapeutic intervention in order to improve the following deficits and impairments:  Postural dysfunction, Improper body mechanics, Increased muscle spasms, Increased fascial restricitons, Decreased mobility, Decreased range of motion, Decreased activity tolerance  Visit Diagnosis: Acute pain of left knee  Other symptoms and signs involving the musculoskeletal system     Problem List Patient Active Problem List   Diagnosis Date Noted  . Left knee pain 09/13/2017  . Left lower quadrant abdominal pain of unknown etiology 09/13/2017    Terease Marcotte P Kecia Swoboda Pt, MPH  10/13/2017, 2:08 PM  [facility] [address] [place], [place], [address] Phone: [phone]   Fax:  [phone]  Name: Logan Mcguire MRN: [id number] Date of Birth: 1991/06/30

## 2017-10-13 NOTE — Patient Instructions (Addendum)
Captain morgan stretch    Pro stretch

## 2017-10-20 ENCOUNTER — Encounter: Payer: Self-pay | Admitting: Rehabilitative and Restorative Service Providers"

## 2017-10-20 ENCOUNTER — Ambulatory Visit: Payer: No Typology Code available for payment source | Admitting: Rehabilitative and Restorative Service Providers"

## 2017-10-20 DIAGNOSIS — M25562 Pain in left knee: Secondary | ICD-10-CM

## 2017-10-20 DIAGNOSIS — R29898 Other symptoms and signs involving the musculoskeletal system: Secondary | ICD-10-CM

## 2017-10-20 NOTE — Patient Instructions (Addendum)
  Strengthening: Hip Abduction (Side-Lying)    Hip extended; leg straight; lead up with heel = toe pointed down, then lift leg __10-12__ inches from surface, keeping knee straight  Repeat _10___ times per set. Do _2-3___ sets per session. Do __1-2__ sessions per day.   Abduction: Clam (Eccentric) - Side-Lying    Lie on side with knees bent. Lift top knee, keeping feet together. Keep trunk steady. Slowly lower for 3-5 seconds. __10_ reps per set, _2-3__ sets per day  Bridging    Slowly raise buttocks from floor, keeping core tight. Band above knees  Repeat __10__ times per set. Do __2-3__ sets per session. Do ____ sessions per day.  Half bridge  Half roll

## 2017-10-20 NOTE — Therapy (Addendum)
[facility] [address] [place], [place], [address] Phone: [phone]   Fax:  [phone]  Physical Therapy Treatment  Patient Details  Name: Logan Mcguire MRN: [id number] Date of Birth: 05-22-1991 Referring Provider: Dr Sheppard Coil   Encounter Date: 10/20/2017  PT End of Session - 10/20/17 0810    Visit Number  5    Number of Visits  6    Date for PT Re-Evaluation  11/01/17    PT Start Time  0800    PT Stop Time  0858    PT Time Calculation (min)  58 min       Past Medical History:  Diagnosis Date  . History of high blood pressure   . History of high cholesterol     History reviewed. No pertinent surgical history.  There were no vitals filed for this visit.      [facility] PT Assessment - 10/20/17 0001      Assessment   Medical Diagnosis  Lt knee pain     Referring Provider  Dr Sheppard Coil    Onset Date/Surgical Date  07/09/17    Hand Dominance  Right    Next MD Visit  PRN       Posture/Postural Control   Posture Comments  stands with Rt hip posteriorly rotated, wt shifted to Lt; head forward; shoulders rounded and elevated; scapulae abducted and rotated along the thoracic wall; LE's in ER       AROM   Right/Left Hip  -- tight end ranges Rt hip; ext and abd Lt hip     Right/Left Ankle  -- tight end ranges Rt ankle       Flexibility   Hamstrings  Rt 70 deg; Lt 75 deg     Quadriceps  tight Rt heel 3 inches from buttocks, 137 deg; Lt 4 inches, 136 deg     ITB  tight bilat Lt > Rt     Piriformis  tight bilat Lt > Rt       Palpation   Patella mobility  tightness with medial glide Lt     Palpation comment  muscular tightness through distal Lt quad; bandng through lateral quads at patella; tightness through the Lt hip adductors; Rt hamstrings       Ambulation/Gait   Gait Comments  ambulates with LE's in ER Rt > Lt                   [facility] Adult PT Treatment/Exercise - 10/20/17 0001      Neuro Re-ed     Neuro Re-ed Details   working on posture and alignment with and equal wt bearing bilat LE's       Knee/Hip Exercises: Stretches   Sports administrator  Left;Right;2 reps;30 seconds added foam roll to distal quad for stretch     Other Knee/Hip Stretches  hip flexor/quad stretch in half kneeling LE elevated on 12 in block with hips shifted fwd(captain Morgan stretch per pt) 30 sec x 2; 45 sec x 1     Other Knee/Hip Stretches  prostretch Rt ankle 45-60 sec x 3      Knee/Hip Exercises: Supine   Bridges  Strengthening;Right;Left;20 reps focus on symmetry     Bridges with Clamshell  Strengthening;Right;Left;10 reps hips level     Single Leg Bridge  Strengthening;Right;20 reps hip protraction       Knee/Hip Exercises: Sidelying   Hip ABduction  Strengthening;Right;3 sets;10 reps leading up w/ heel - hips rolled  forward to isolate glut med    Clams  green TB Rt focus on eccentric control 10 reps x 3 sets       Moist Heat Therapy   Number Minutes Moist Heat  12 Minutes    Moist Heat Location  Knee anterior thigh       Electrical Stimulation   Electrical Stimulation Location   Lt quads/adductors     Electrical Stimulation Action  IFC    Electrical Stimulation Parameters  to tolerance    Electrical Stimulation Goals  Pain;Tone      Manual Therapy   Manual therapy comments  pt supine     Joint Mobilization  patellar mobs     Soft tissue mobilization  deep tissue work through the mid to distal quads medially and laterally        Trigger Point Dry Needling - 10/20/17 0859    Consent Given?  Yes    Muscles Treated Lower Body  -- Lt with estim     Tensor Fascia Lata Response  Palpable increased muscle length    Quadriceps Response  Palpable increased muscle length    Adductor Response  Palpable increased muscle length           PT Education - 10/20/17 0850    Education provided  Yes    Education Details  HEP     Person(s) Educated  Patient    Methods  Explanation;Demonstration;Tactile  cues;Verbal cues;Handout    Comprehension  Verbalized understanding;Returned demonstration;Verbal cues required;Tactile cues required          PT Long Term Goals - 10/20/17 1254      PT LONG TERM GOAL #1   Title  Improve joint mobility and ROM bilat hips and Rt ankle DF to allow improved movement patterns 11/01/17    Time  6    Period  Weeks    Status  On-going      PT LONG TERM GOAL #2   Title  Decrease Lt knee pain with functional activities by 75-80% including squatting, prolonged walking (> than 45 min); descending stairs 11/01/17    Time  6    Period  Weeks    Status  Partially Met      PT LONG TERM GOAL #3   Title  Decrease banding distal quad to palpation Lt LE 11/01/17    Time  6    Period  Weeks    Status  Partially Met      PT LONG TERM GOAL #4   Title  Independent in HEP 11/01/17    Time  6    Period  Weeks    Status  On-going      PT LONG TERM GOAL #5   Title  Improve FOTO to </= 26% limitation 11/01/17    Time  6    Period  Weeks    Status  On-going            Plan - 10/20/17 1255    Clinical Impression Statement  Some changes in the tightness mid patella at insertion of quad - continues to have some tightness and aching in the medial quad insertion. Soreness following DN which resolves the following day and musculature of Lt thigh feels looser. Addressed subtle postural changes in standing. Demonstrates slight posterior rotation of Lt hip with weight shifted to Lt. Corrected with work in the clinic. Added Rt hip strengthening to improve stability and wt bearing on Rt - avoid shift to Lt with functional  and exercise activities.     Rehab Potential  Good    PT Frequency  1x / week    PT Duration  6 weeks    PT Treatment/Interventions  Patient/family education;ADLs/Self Care Home Management;Cryotherapy;Electrical Stimulation;Iontophoresis 30m/ml Dexamethasone;Moist Heat;Ultrasound;Dry needling;Manual techniques;Neuromuscular re-education;Therapeutic  activities;Therapeutic exercise;Taping    PT Next Visit Plan  assess response to neuromuscular work - postural corrections; continue DN; deep tissue work v DN Lt quad; modalities and taping as indicated     Consulted and Agree with Plan of Care  Patient       Patient will benefit from skilled therapeutic intervention in order to improve the following deficits and impairments:  Postural dysfunction, Improper body mechanics, Increased muscle spasms, Increased fascial restricitons, Decreased mobility, Decreased range of motion, Decreased activity tolerance  Visit Diagnosis: Acute pain of left knee  Other symptoms and signs involving the musculoskeletal system     Problem List Patient Active Problem List   Diagnosis Date Noted  . Left knee pain 09/13/2017  . Left lower quadrant abdominal pain of unknown etiology 09/13/2017    Celyn PNilda SimmerPT, MPH  10/20/2017, 12:59 PM  [facility] [address]Phone: [phone]  Fax:  [phone] Name: JBurnard EnisMRN: [id number] of Birth: [birth year] PHYSICAL THERAPY DISCHARGE SUMMARY  Visits from Start of Care: 5  Current functional level related to goals / functional outcomes: Persistent Lt knee pain    Remaining deficits: Pelvic asymmetry with weight shift to the Lt with gait and functional activities    Education / Equipment: HEP  Plan: Patient agrees to discharge.  Patient goals were partially met. Patient is being discharged due to                                                     ?????    Will continue with independent HEP Consider active release technique   Celyn P. HHelene KelpPT, MPH 11/22/17 3:25 PM

## 2017-11-01 ENCOUNTER — Telehealth: Payer: Self-pay

## 2017-11-01 DIAGNOSIS — G8929 Other chronic pain: Secondary | ICD-10-CM

## 2017-11-01 DIAGNOSIS — M25561 Pain in right knee: Principal | ICD-10-CM

## 2017-11-01 DIAGNOSIS — M25562 Pain in left knee: Principal | ICD-10-CM

## 2017-11-01 NOTE — Telephone Encounter (Signed)
Per pt - completed 6 wks of PT & his knees are no better. Pt was informed by Physical therapist to be check by a chiropractor. Pt is requesting a referral due to their insurance. Thanks.

## 2017-11-02 NOTE — Telephone Encounter (Signed)
I sent referral to chiropractor. Would also strongly consider follow-up with one of our sports medicine doctors here in the office

## 2017-11-02 NOTE — Telephone Encounter (Signed)
Pt has been advised.

## 2017-11-06 ENCOUNTER — Encounter: Payer: No Typology Code available for payment source | Admitting: Rehabilitative and Restorative Service Providers"

## 2017-12-04 ENCOUNTER — Encounter: Payer: Self-pay | Admitting: Family Medicine

## 2017-12-04 ENCOUNTER — Ambulatory Visit (INDEPENDENT_AMBULATORY_CARE_PROVIDER_SITE_OTHER): Payer: No Typology Code available for payment source | Admitting: Family Medicine

## 2017-12-04 ENCOUNTER — Ambulatory Visit (INDEPENDENT_AMBULATORY_CARE_PROVIDER_SITE_OTHER): Payer: No Typology Code available for payment source

## 2017-12-04 DIAGNOSIS — M76899 Other specified enthesopathies of unspecified lower limb, excluding foot: Secondary | ICD-10-CM

## 2017-12-04 DIAGNOSIS — M76892 Other specified enthesopathies of left lower limb, excluding foot: Secondary | ICD-10-CM

## 2017-12-04 DIAGNOSIS — M7700 Medial epicondylitis, unspecified elbow: Secondary | ICD-10-CM | POA: Insufficient documentation

## 2017-12-04 DIAGNOSIS — M7702 Medial epicondylitis, left elbow: Secondary | ICD-10-CM | POA: Diagnosis not present

## 2017-12-04 DIAGNOSIS — M76891 Other specified enthesopathies of right lower limb, excluding foot: Secondary | ICD-10-CM

## 2017-12-04 MED ORDER — NITROGLYCERIN 0.2 MG/HR TD PT24: MEDICATED_PATCH | TRANSDERMAL | 1 refills | Status: DC

## 2017-12-04 NOTE — Patient Instructions (Signed)
Thank you for coming in today.  I think you have quad tenonitis and medial epicondylitis   Nitroglycerin Protocol   Apply 1/4 nitroglycerin patch to affected area daily.  Change position of patch within the affected area every 24 hours.  You may experience a headache during the first 1-2 weeks of using the patch, these should subside.  If you experience headaches after beginning nitroglycerin patch treatment, you may take your preferred over the counter pain reliever.  Another side effect of the nitroglycerin patch is skin irritation or rash related to patch adhesive.  Please notify our office if you develop more severe headaches or rash, and stop the patch.  Tendon healing with nitroglycerin patch may require 12 to 24 weeks depending on the extent of injury.  Men should not use if taking Viagra, Cialis, or Levitra.   Do not use if you have migraines or rosacea.   Do the exercises we dicussed for both the knee and elbow.    Patellar Tendinitis Patellar tendinitis, also called jumper's knee, is inflammation of the patellar tendon. Tendons are cord-like tissues that connect muscles to bones. The patellar tendon connects the bottom of the kneecap (patella) to the top of the shin bone (tibia). Patellar tendinitis causes pain in the front of the knee. The condition happens in the following stages:  Stage 1. In this stage, there is pain only after activity.  Stage 2: In this stage, you have pain during and after activity.  Stage 3: In this stage, you have pain during and after activity that limits your ability to do the activity.  Stage 4: In this stage, the tendon tears and severely limits your activity.  What are the causes? This condition is caused by repeated (repetitive) stress on the tendon. This stress may cause the tendon to stretch, swell, thicken, or tear. What increases the risk? The following factors make you more likely to develop this condition:  Participating in  sports that involve running, kicking and jumping, especially on hard surfaces. These include: ? Basketball. ? Volleyball. ? Soccer. ? Track and field.  Having tight thigh muscles.  Having received steroid injections in the tendon.  Having had knee surgery.  Being 32-80 years old.  Having rheumatoid arthritis or diabetes.  Training too hard.  What are the signs or symptoms? The main symptom of this condition is pain in the front of the knee. The pain usually starts slowly then it gradually gets worse. It may become painful to straighten your leg. How is this diagnosed? This condition may be diagnosed based on:  Your symptoms.  A medical history.  A physical exam. During the physical exam, your health care provider may check for tenderness in your patella, tightness in your thigh muscles, and pain when you straighten your knee.  Imaging tests, including: ? X-rays. These will show the position and condition of your patella. ? MRI. This will show any tears in your tendon. ? Ultrasound. This will show any swelling in your tendon and the thickness of your tendon.  How is this treated? Treatment for this condition depends on the stage of the condition. It may involve:  Avoiding activities that cause pain.  Icing your knee.  Taking an NSAID to reduce pain and swelling.  Doing stretching and strengthening exercises (physical therapy) when pain and swelling improve.  Having sound wave stimulation to promote healing.  Wearing a knee brace. This may be needed if your condition does not improve with treatment.  Using crutches  or a walker. This may be needed if your condition does not improve with treatment.  Surgery. This may be done if you have stage 4 tendinitis.  Follow these instructions at home: If You Have a Knee Brace:  Wear it as told by your health care provider. Remove it only as told by your health care provider.  Loosen the brace if your toes become numb and  tingle, or if they turn cold and blue.  Do not let your brace get wet if it is not waterproof.  Keep the brace clean.  Ask your health care provider when it is safe for you to drive. Managing pain, stiffness, and swelling  Take over-the-counter and prescription medicines only as told by your health care provider.  If directed, apply ice to the injured area. ? Put ice in a plastic bag. ? Place a towel between your skin and the bag. ? Leave the ice on for 20 minutes, 2-3 times a day.  Move your toes often to avoid stiffness and to lessen swelling.  Raise (elevate) your knee above the level of your heart while you are sitting or lying down. Activity  Return to your normal activities as told by your health care provider. Ask your health care provider what activities are safe for you.  Do exercises as told by your health care provider. General instructions  Do not use the injured limb to support your body weight until your health care provider says that you can. Use your crutches or walker as told by your health care provider.  Keep all follow-up visits as told by your health care provider. This is important. How is this prevented?  Warm up and stretch before being active.  Cool down and stretch after being active.  Give your body time to rest between periods of activity.  Make sure to use equipment that fits you.  Be safe and responsible while being active to avoid falls.  Do at least 150 minutes of moderate-intensity exercise each week, such as brisk walking or water aerobics.  Maintain physical fitness, including: ? Strength. ? Flexibility. ? Cardiovascular fitness. ? Endurance. Contact a health care provider if:  Your symptoms have not improve in 6 weeks.  Your symptoms get worse. This information is not intended to replace advice given to you by your health care provider. Make sure you discuss any questions you have with your health care provider. Document  Released: 08/01/2005 Document Revised: 04/05/2016 Document Reviewed: 05/05/2015 Elsevier Interactive Patient Education  2018 [employer] Inc[employer]    Golfer's Elbow Golfer's elbow, also called medial epicondylitis, is a condition that results from inflammation of the strong bands of tissue (tendons) that attach your forearm muscles to the inside of your bone at the elbow. These tendons affect the muscles that bend the palm toward the wrist (flexion). This condition is called golfer's elbow because it is more common among people who constantly bend and twist their wrists, such as golfers. This injury usually results from overuse. Tendons also become less flexible with age. This condition causes elbow pain that may spread to your forearm and upper arm. The pain may get worse when you bend your wrist downward. What are the causes? This condition is an overuse injury that is caused by:  Repeatedly flexing, turning, or twisting your wrist.  Constantly gripping objects with your hands.  What increases the risk? This condition is more likely to develop in people who play golf or tennis or have jobs that require the constant  use of their hands. This injury is more common among:  Carpenters.  Gardeners.  Musicians.  Bricklayers.  Typists.  What are the signs or symptoms? Symptoms of this condition include:  Pain near the inner elbow or forearm.  Reduced grip strength.  How is this diagnosed? This condition is diagnosed based on your symptoms, medical history, and physical exam. During the exam, your health care provider may test your grip strength and move your wrist to check for pain. You may also have an MRI to confirm the diagnosis, look for other issues, and check for tears in the ligaments, muscles, or tendons. How is this treated? Treatment for this condition includes:  Stopping all activities that make you bend or twist your wrist until your pain and other symptoms go away.  Icing  your wrist to relieve pain.  Taking NSAIDs or getting corticosteroid injections to reduce pain and swelling.  Doing stretches, range-of-motion, and strengthening exercises (physical therapy) as told by your health care provider.  In rare cases, surgery may be needed if your condition does not improve. Follow these instructions at home:  If directed, apply ice to the injured area. ? Put ice in a plastic bag. ? Place a towel between your skin and the bag. ? Leave the ice on for 20 minutes, 2-3 times a day.  Move your fingers often to avoid stiffness.  Raise (elevate) the injured area above the level of your heart while you are sitting or lying down.  Return to your normal activities as told by your health care provider. Ask your health care provider what activities are safe for you.  Do exercises as told by your health care provider.  Do not use tobacco products, including cigarettes, chewing tobacco, or e-cigarettes. If you need help quitting, ask your health care provider.  Take over-the-counter and prescription medicines only as told by your health care provider.  Keep all follow-up visits as told by your health care provider. This is important. How is this prevented?  Warm up and stretch before being active.  Cool down and stretch after being active.  Give your body time to rest between periods of activity.  Make sure to use equipment that fits you.  Be safe and responsible while being active to avoid falls.  Do at least 150 minutes of moderate-intensity exercise each week, such as brisk walking or water aerobics.  Maintain physical fitness, including: ? Strength. ? Flexibility. ? Cardiovascular fitness. ? Endurance.  Perform exercises to strengthen the forearm muscles.  Slow your golf swing to reduce shock in the arm when making contact with the ball, if you play golf. Contact a health care provider if:  Your pain does not improve or it gets worse.  You notice  numbness in your hand. Get help right away if:  Your pain is severe.  You cannot move your wrist. This information is not intended to replace advice given to you by your health care provider. Make sure you discuss any questions you have with your health care provider. Document Released: 08/01/2005 Document Revised: 04/05/2016 Document Reviewed: 04/13/2015 Elsevier Interactive Patient Education  [employer]2018 [employer] Inc.

## 2017-12-04 NOTE — Progress Notes (Signed)
Logan Mcguire is a 27 y.o. male who [facility] today for:   Left Knee Pain: The patient reports moderate left knee pain with exercise that began in November 2018 after participating in a high intensity squat workout program. He describes the pain as sharp and elicited by squating movements and getting up from chairs. The pain  It resolves with rest. He was seen by Dr. Lyn HollingsheadAlexander in January and prescribed general knee physical therapy and diclofenac gel, but neither of these alleviated his symptoms. He has forgone lower leg exercises since the onset of this.   Left Elbow Pain Patient reports he has shifted to doing upper body workouts since his knee injury. Now he has recently been experiencing medial elbow pain with elbow flexion exercises. The pain is dull and worse with use. The pain does not radiate towards his hand and he does not have any numbness in his fingers. He has not taken anything to alleviate these symptoms.    Past Medical History:  Diagnosis Date  . History of high blood pressure   . History of high cholesterol    No past surgical history on file. Social History   Tobacco Use  . Smoking status: Never Smoker  . Smokeless tobacco: Never Used  Substance Use Topics  . Alcohol use: Yes    Alcohol/week: 0.0 oz    Comment: Socially   family history includes Diabetes in his maternal grandfather and maternal grandmother; Heart attack in his maternal grandfather and maternal grandmother; High Cholesterol in his maternal grandfather and maternal grandmother; High blood pressure in his maternal grandfather and maternal grandmother; Lung cancer in his paternal grandfather; Prostate cancer in his paternal grandfather.  ROS as above:  Medications: Current Outpatient Medications  Medication Sig Dispense Refill  . nitroGLYCERIN (NITRODUR - DOSED IN MG/24  HR) 0.2 mg/hr patch 1/4 patch to knee daily for tendonitis 30 patch 1   No current facility-administered medications for this visit.    No Known Allergies  Health Maintenance Health Maintenance  Topic Date Due  . HIV Screening  11/06/2005  . TETANUS/TDAP  11/06/2009  . INFLUENZA VACCINE  05/14/2018 (Originally 03/15/2018)     Exam:  BP (!) 141/59   Pulse 88   Wt 212 lb (96.2 kg)   SpO2 100%   BMI 25.81 kg/m  Gen: Well NAD HEENT: EOMI,  MMM Lungs: Normal work of breathing. CTABL Heart: RRR no MRG Abd: NABS, Soft. Nondistended, Nontender Exts: Brisk capillary refill, warm and well perfused.    MSK:   Left Knee:  No erythema, deformities, or obvious effusions Tender to palpation at the insertion of the quadriceps tendon with palpable squeaks.  Ligaments intact ROM intact, Strength 5/5  No numbness in toes   Left Elbow No erythema, deformities, or obvious effusions Tender to palpation at medial epicondyle  Pain elicited with wrist flexion  Ligaments intact ROM intact, Strength 5/5 No numbness in fingers  Limited MSK US of the left knee: Hyperechoic calcification changes seen at the insertion of the quad tendon to the superior patellar pole.  No significant increased Doppler activity present. No tendon disruption or large effusion present. Normal bony structures. Impression: Chronic insertional quadriceps tendinitis.   Dg Knee 1-2 Views Right  Result Date: 12/04/2017 CLINICAL DATA:  Pain EXAM: RIGHT KNEE - 1-2 VIEW COMPARISON:  None. FINDINGS: Standing frontal and standing tunnel images obtained. There is slight narrowing medially. No fracture or  dislocation. No erosive change on submitted views. IMPRESSION: Slight narrowing medially.  No fracture or dislocation. Electronically Signed   By: Logan Mcguire M.D.   On: 12/04/2017 08:46   Dg Knee Complete 4 Views Left  Result Date: 12/04/2017 CLINICAL DATA:  Pain EXAM: LEFT KNEE - COMPLETE 4+ VIEW COMPARISON:   None. FINDINGS: Standing frontal, standing tunnel, lateral, and sunrise patellar images were obtained. No fracture or dislocation. No joint effusion. There is slight narrowing medially, symmetric with the contralateral side. Other joint spaces appear normal. No erosive change. No appreciable spurring. IMPRESSION: Slight narrowing medially, symmetric with the contralateral side. Other joint spaces appear normal. No appreciable spurring. No fracture or joint effusion. Electronically Signed   By: Logan Mcguire M.D.   On: 12/04/2017 08:47   I personally (independently) visualized and performed the interpretation of the images attached in this note.  Assessment and Plan: 27 y.o. male with   Left Knee pain: Today we used Ultrasound to narrow down the differential to likely being quadriceps tendonitis. Patient had undergone physical therapy but had not targeted quadricept tendon with eccentric load bearing exercises. He will perform eccentric 1 leg squats to approximately 70 degrees and leg extension exercises while squeezing legs together on leg extension machine at gym. He will also apply 1/4 nitroglycerine patches to his quadricept tendon to encourage healing. He should refrain from heavy squats and other exercises that elicit pain for 6 weeks.  Check 6 weeks.  Left elbow pain:  Patients physical exam and story of high intensity crossfit workouts makes Golfers elbow or medial epicondylitis the most likely diagnosis. He should perform eccentric weighted wrist flexion exercises as well as stretching his hand in extension. He can apply 1/4 nitroglycerine patch to the tender area as well, but should not do both his knee and his elbow at the same time to avoid adverse side effects.    Orders Placed This Encounter  Procedures  . DG Knee 1-2 Views Right    Standing Status:   Future    Number of Occurrences:   1    Standing Expiration Date:   02/04/2019    Order Specific Question:   Reason for Exam  (SYMPTOM  OR DIAGNOSIS REQUIRED)    Answer:   For use with the left knee x-ray bilateral AP and Rosenberg standing.    Order Specific Question:   Preferred imaging location?    Answer:   [facility]  . DG Knee Complete 4 Views Left    Please include patellar sunrise, lateral, and weightbearing bilateral AP and bilateral rosenberg views    Standing Status:   Future    Number of Occurrences:   1    Standing Expiration Date:   02/03/2019    Order Specific Question:   Reason for exam:    Answer:   Please include patellar sunrise, lateral, and weightbearing bilateral AP and bilateral rosenberg views    Comments:   Please include patellar sunrise, lateral, and weightbearing bilateral AP and bilateral rosenberg views    Order Specific Question:   Preferred imaging location?    Answer:   [facility]   Meds ordered this encounter  Medications  . nitroGLYCERIN (NITRODUR - DOSED IN MG/24 HR) 0.2 mg/hr patch    Sig: 1/4 patch to knee daily for tendonitis    Dispense:  30 patch    Refill:  1     Discussed warning signs or symptoms. Please see discharge instructions. Patient expresses understanding.

## 2018-01-02 ENCOUNTER — Encounter: Payer: Self-pay | Admitting: Osteopathic Medicine

## 2018-01-02 ENCOUNTER — Ambulatory Visit (INDEPENDENT_AMBULATORY_CARE_PROVIDER_SITE_OTHER): Payer: No Typology Code available for payment source | Admitting: Osteopathic Medicine

## 2018-01-02 VITALS — BP 145/76 | HR 85 | Temp 97.6°F | Wt 211.1 lb

## 2018-01-02 DIAGNOSIS — R1032 Left lower quadrant pain: Secondary | ICD-10-CM

## 2018-01-02 DIAGNOSIS — M76899 Other specified enthesopathies of unspecified lower limb, excluding foot: Secondary | ICD-10-CM | POA: Diagnosis not present

## 2018-01-02 DIAGNOSIS — Z23 Encounter for immunization: Secondary | ICD-10-CM | POA: Diagnosis not present

## 2018-01-02 DIAGNOSIS — Z8639 Personal history of other endocrine, nutritional and metabolic disease: Secondary | ICD-10-CM

## 2018-01-02 DIAGNOSIS — Z0000 Encounter for general adult medical examination without abnormal findings: Secondary | ICD-10-CM | POA: Diagnosis not present

## 2018-01-02 NOTE — Progress Notes (Signed)
HPI: Logan Mcguire is a 27 y.o. male who  has a past medical history of History of high blood pressure and History of high cholesterol.  he presents to [facility] today, 01/02/18,  for chief complaint of: L pelvic/groin pain   Pelvic/Abdominal  . Location: center/left lower abdomen/pelvis . Quality:cramping type somewhat dull, almost feels like a lump is present  . Duration: several months . Timing:intermittent, nothing really seems to trigger it, cannot associate with any factors such as eating. Seems worse with leaning forward. Nothing attributed to other leg/muscle movements  . Assoc signs/symptoms: no groin or testicular pain, occasional lower back pain       Past medical, surgical, social and family history reviewed:  Patient Active Problem List   Diagnosis Date Noted  . Quadriceps tendonitis 12/04/2017  . Medial epicondylitis 12/04/2017  . Left knee pain 09/13/2017  . Left lower quadrant abdominal pain of unknown etiology 09/13/2017   No past surgical history on file.  Social History   Tobacco Use  . Smoking status: Never Smoker  . Smokeless tobacco: Never Used  Substance Use Topics  . Alcohol use: Yes    Alcohol/week: 0.0 oz    Comment: Socially    Family History  Problem Relation Age of Onset  . Heart attack Maternal Grandmother   . Diabetes Maternal Grandmother   . High blood pressure Maternal Grandmother   . High Cholesterol Maternal Grandmother   . Heart attack Maternal Grandfather   . Diabetes Maternal Grandfather   . High blood pressure Maternal Grandfather   . High Cholesterol Maternal Grandfather   . Lung cancer Paternal Grandfather   . Prostate cancer Paternal Grandfather      Current medication list and allergy/intolerance information reviewed:    Current Outpatient Medications  Medication Sig Dispense Refill  . nitroGLYCERIN (NITRODUR - DOSED IN MG/24 HR) 0.2 mg/hr patch 1/4 patch to knee daily for  tendonitis 30 patch 1   No current facility-administered medications for this visit.     No Known Allergies    Review of Systems:  Constitutional:  No  fever, no chills, No recent illness  HEENT: No  headache, no vision change  Cardiac: No  chest pain, No  pressure, No palpitations  Respiratory:  No  shortness of breath. No  Cough  Gastrointestinal: No abdominal pain, No  nausea, No  vomiting,  No  blood in stool, No  diarrhea, No  constipation   Musculoskeletal: +myalgia/arthralgia  Skin: No  Rash  Genitourinary: No  incontinence, No  abnormal genital bleeding, No abnormal genital discharge  Hem/Onc: No  easy bruising/bleeding, No  abnormal lymph node  Neurologic: No  weakness, No  dizziness  Psychiatric: No  concerns with depression, No  concerns with anxiety  Exam:  BP (!) 145/76 (BP Location: Left Arm, Patient Position: Sitting, Cuff Size: Normal)   Pulse 85   Temp 97.6 F (36.4 C) (Oral)   Wt 211 lb 1.6 oz (95.8 kg)   BMI 25.70 kg/m   Constitutional: VS see above. General Appearance: alert, well-developed, well-nourished, NAD  Eyes: Normal lids and conjunctive, non-icteric sclera  Ears, Nose, Mouth, Throat: MMM, Normal external inspection ears/nares/mouth/lips/gums.   Neck: No masses, trachea midline. No thyroid enlargement  Respiratory: Normal respiratory effort. no wheeze, no rhonchi, no rales  Cardiovascular: S1/S2 normal, no murmur, no rub/gallop auscultated. RRR. No lower extremity edema.   Gastrointestinal: Nontender, no masses. No hepatomegaly, no splenomegaly. No hernia appreciated. Bowel sounds normal. Rectal  exam deferred.   Musculoskeletal: Gait normal. No clubbing/cyanosis of digits.   Neurological: Normal balance/coordination. No tremor.  Skin: warm, dry, intact. No rash/ulcer.   Psychiatric: Normal judgment/insight. Normal mood and affect. Oriented x3.       ASSESSMENT/PLAN:   Left lower quadrant abdominal pain of unknown  etiology - persistent, not typical for hernia or muscle strain, though MSK problem seems more likely. Let's get imaging and go from there, consider sports med 2nd opinion - Plan: CBC, CMP14+EGFR, CBC, COMPLETE METABOLIC PANEL WITH GFR  Left lower quadrant pain - Plan: CT Abdomen Pelvis W Contrast, CBC, COMPLETE METABOLIC PANEL WITH GFR  Left groin pain - referral for [facility] plan  - Plan: Ambulatory referral to Sports Medicine  Quadriceps tendinitis - referral for [facility]  - Plan: Ambulatory referral to Sports Medicine  Annual physical exam - Plan: CBC, CMP14+EGFR, Lipid panel, CBC, COMPLETE METABOLIC PANEL WITH GFR, Lipid panel  Need for Tdap vaccination - Plan: Tdap vaccine greater than or equal to 7yo IM  History of high cholesterol - Plan: CMP14+EGFR, Lipid panel, Lipid panel    Patient Instructions   Will call you to schedule CT of the abdomen  On Focus plan, I put in referral for Dr Georgina Snell as sports medicine specialist - he may be a good person to discuss this issue with for second opinion if we aren't finding anything on the CT sche     Visit summary with medication list and pertinent instructions was printed for patient to review. All questions at time of visit were answered - patient instructed to contact office with any additional concerns. ER/RTC precautions were reviewed with the patient.   Follow-up plan: Return for recheck w/ Dr Corey/sports med depending on symptoms .    Please note: voice recognition software was used to produce this document, and typos may escape review. Please contact Dr. Sheppard Coil for any needed clarifications.

## 2018-01-02 NOTE — Patient Instructions (Addendum)
   Will call you to schedule CT of the abdomen  On Focus plan, I put in referral for Dr Denyse Amass as sports medicine specialist - he may be a good person to discuss this issue with for second opinion if we aren't finding anything on the CT sche

## 2018-01-03 LAB — COMPLETE METABOLIC PANEL WITH GFR
AG Ratio: 2 (calc) (ref 1.0–2.5)
ALKALINE PHOSPHATASE (APISO): 73 U/L (ref 40–115)
ALT: 19 U/L (ref 9–46)
AST: 24 U/L (ref 10–40)
Albumin: 4.3 g/dL (ref 3.6–5.1)
BUN: 17 mg/dL (ref 7–25)
CALCIUM: 9.3 mg/dL (ref 8.6–10.3)
CO2: 29 mmol/L (ref 20–32)
CREATININE: 1.15 mg/dL (ref 0.60–1.35)
Chloride: 102 mmol/L (ref 98–110)
GFR, EST NON AFRICAN AMERICAN: 87 mL/min/{1.73_m2} (ref >=60)
GFR, Est African American: 101 mL/min/{1.73_m2} (ref >=60)
GLOBULIN: 2.2 g/dL (ref 1.9–3.7)
GLUCOSE: 93 mg/dL (ref 65–99)
Potassium: 4 mmol/L (ref 3.5–5.3)
Sodium: 138 mmol/L (ref 135–146)
Total Bilirubin: 1 mg/dL (ref 0.2–1.2)
Total Protein: 6.5 g/dL (ref 6.1–8.1)

## 2018-01-03 LAB — CBC
HCT: 42.6 % (ref 38.5–50.0)
HEMOGLOBIN: 14.8 g/dL (ref 13.2–17.1)
MCH: 29.7 pg (ref 27.0–33.0)
MCHC: 34.7 g/dL (ref 32.0–36.0)
MCV: 85.5 fL (ref 80.0–100.0)
MPV: 10.9 fL (ref 7.5–12.5)
PLATELETS: 219 10*3/uL (ref 140–400)
RBC: 4.98 10*6/uL (ref 4.20–5.80)
RDW: 12.4 % (ref 11.0–15.0)
WBC: 4.8 10*3/uL (ref 3.8–10.8)

## 2018-01-03 LAB — LIPID PANEL
CHOL/HDL RATIO: 3.5 (calc) (ref <5.0)
CHOLESTEROL: 183 mg/dL (ref <200)
HDL: 52 mg/dL (ref >40)
LDL CHOLESTEROL (CALC): 111 mg/dL — AB
NON-HDL CHOLESTEROL (CALC): 131 mg/dL — AB (ref <130)
TRIGLYCERIDES: 95 mg/dL (ref <150)

## 2018-01-05 ENCOUNTER — Other Ambulatory Visit: Payer: No Typology Code available for payment source

## 2018-01-05 ENCOUNTER — Ambulatory Visit (INDEPENDENT_AMBULATORY_CARE_PROVIDER_SITE_OTHER): Payer: No Typology Code available for payment source

## 2018-01-05 DIAGNOSIS — R1032 Left lower quadrant pain: Secondary | ICD-10-CM | POA: Diagnosis not present

## 2018-01-05 MED ORDER — IOPAMIDOL (ISOVUE-300) INJECTION 61%
100.0000 mL | Freq: Once | INTRAVENOUS | Status: AC | PRN
  Administered 2018-01-05: 100 mL via INTRAVENOUS

## 2018-01-15 ENCOUNTER — Ambulatory Visit: Payer: No Typology Code available for payment source | Admitting: Family Medicine

## 2018-02-06 ENCOUNTER — Ambulatory Visit (INDEPENDENT_AMBULATORY_CARE_PROVIDER_SITE_OTHER): Payer: No Typology Code available for payment source | Admitting: Osteopathic Medicine

## 2018-02-06 ENCOUNTER — Encounter: Payer: Self-pay | Admitting: Osteopathic Medicine

## 2018-02-06 VITALS — BP 130/62 | HR 66 | Temp 98.1°F | Wt 214.5 lb

## 2018-02-06 DIAGNOSIS — Z0000 Encounter for general adult medical examination without abnormal findings: Secondary | ICD-10-CM | POA: Diagnosis not present

## 2018-02-06 NOTE — Patient Instructions (Addendum)
General Preventive Care  Most recent routine screening lipids/other labs: reviewed today  Tobacco: don't! Alcohol: moderation is ok for most people. Recreational/Illicit Drugs: don't!  Exercise: as tolerated to reduce risk of cardiovascular disease and diabetes  Mental health: if need for mental health care (medicines, counseling, other), or concerns about moods, please let me know!   Sexual health: if need for STD testing, please let me know!   Vaccines  Flu vaccine: recommended every fall (by Halloween!)  Shingles vaccine: Shingrix recommended after age 27  Pneumonia vaccines: Prevnar and Pneumovax recommended after age 27  Tetanus booster: Tdap recommended every 10 years  Cancer screenings   Colon cancer screening: recommended at age 27, colonoscopy sooner if risk factors   Prostate cancer screening: recommendations vary, optional PSA blood test form en around age 27  Infection screenings . HIV: recommended screening at least once age 27-65, more often if risk factors  . Gonorrhea/Chlamydia: many insurances require testing for anyone on birth control pills, otherwise screening as needed . Hepatitis C: recommended for anyone born [id number]  Other . Bone Density Test: recommended for women at age 27, men at age 27 . Advanced Directive: Living Will and/or Healthcare Power of Attorney recommended for everyone, regardless of age or health . Cholesterol & Diabeted: recommended screening every few years for young, healthy folks . Thyroid and Vitamin D: routine screening not recommended, most insurance will not cover this test

## 2018-02-06 NOTE — Progress Notes (Signed)
HPI: Logan Mcguire is a 27 y.o. male who  has a past medical history of History of high blood pressure and History of high cholesterol.  he presents to [facility] today, 02/06/18,  for chief complaint of: Annual Physical  Patient here for annual physical / wellness exam.  See preventive care reviewed as below.  Recent labs reviewed in detail with the patient.   Additional concerns today include:  None   Past medical, surgical, social and family history reviewed:  Patient Active Problem List   Diagnosis Date Noted  . Quadriceps tendonitis 12/04/2017  . Medial epicondylitis 12/04/2017  . Left knee pain 09/13/2017  . Left lower quadrant abdominal pain of unknown etiology 09/13/2017    No past surgical history on file.  Social History   Tobacco Use  . Smoking status: Never Smoker  . Smokeless tobacco: Never Used  Substance Use Topics  . Alcohol use: Yes    Alcohol/week: 0.0 oz    Comment: Socially    Family History  Problem Relation Age of Onset  . Heart attack Maternal Grandmother   . Diabetes Maternal Grandmother   . High blood pressure Maternal Grandmother   . High Cholesterol Maternal Grandmother   . Heart attack Maternal Grandfather   . Diabetes Maternal Grandfather   . High blood pressure Maternal Grandfather   . High Cholesterol Maternal Grandfather   . Lung cancer Paternal Grandfather   . Prostate cancer Paternal Grandfather      Current medication list and allergy/intolerance information reviewed:    Current Outpatient Medications  Medication Sig Dispense Refill  . nitroGLYCERIN (NITRODUR - DOSED IN MG/24 HR) 0.2 mg/hr patch 1/4 patch to knee daily for tendonitis 30 patch 1   No current facility-administered medications for this visit.     No Known Allergies    Review of Systems:  Constitutional:  No  fever, no chills, No recent illness, No unintentional weight changes. No significant fatigue.   HEENT: No   headache, no vision change, no hearing change, No sore throat, No  sinus pressure  Cardiac: No  chest pain, No  pressure, No palpitations, No  Orthopnea  Respiratory:  No  shortness of breath  Gastrointestinal: No  abdominal pain, No  nausea, No  vomiting,  No  blood in stool, No  diarrhea, No  constipation   Musculoskeletal: No new myalgia/arthralgia  Skin: No  Rash  Genitourinary: No  incontinence, No  abnormal genital bleeding, No abnormal genital discharge  Hem/Onc: No  easy bruising/bleeding  Endocrine: No cold intolerance,  No heat intolerance. No polyuria/polydipsia/polyphagia   Neurologic: No  weakness, No  dizziness  Psychiatric: No  concerns with depression, No  concerns with anxiety, No sleep problems, No mood problems  Exam:  BP 130/62 (BP Location: Left Arm, Patient Position: Sitting, Cuff Size: Normal)   Pulse 66   Temp 98.1 F (36.7 C) (Oral)   Wt 214 lb 8 oz (97.3 kg)   BMI 26.11 kg/m   Constitutional: VS see above. General Appearance: alert, well-developed, well-nourished, NAD  Eyes: Normal lids and conjunctive, non-icteric sclera  Ears, Nose, Mouth, Throat: MMM, Normal external inspection ears/nares/mouth/lips/gums. TM normal bilaterally. Pharynx/tonsils no erythema, no exudate. Nasal mucosa normal.   Neck: No masses, trachea midline. No thyroid enlargement. No tenderness/mass appreciated. No lymphadenopathy  Respiratory: Normal respiratory effort. no wheeze, no rhonchi, no rales  Cardiovascular: S1/S2 normal, no murmur, no rub/gallop auscultated. RRR. No lower extremity edema.  Gastrointestinal: Nontender, no masses.  No hepatomegaly, no splenomegaly. No hernia appreciated. Bowel sounds normal. Rectal exam deferred.   Musculoskeletal: Gait normal. No clubbing/cyanosis of digits.   Neurological: Normal balance/coordination. No tremor.    Skin: warm, dry, intact.  Psychiatric: Normal judgment/insight. Normal mood and affect. Oriented x3.       ASSESSMENT/PLAN:   Annual physical exam    Patient Instructions  General Preventive Care  Most recent routine screening lipids/other labs: reviewed today  Tobacco: don't! Alcohol: moderation is ok for most people. Recreational/Illicit Drugs: don't!  Exercise: as tolerated to reduce risk of cardiovascular disease and diabetes  Mental health: if need for mental health care (medicines, counseling, other), or concerns about moods, please let me know!   Sexual health: if need for STD testing, please let me know!   Vaccines  Flu vaccine: recommended every fall (by Halloween!)  Shingles vaccine: Shingrix recommended after age 12  Pneumonia vaccines: Prevnar and Pneumovax recommended after age 73  Tetanus booster: Tdap recommended every 10 years  Cancer screenings   Colon cancer screening: recommended at age 46, colonoscopy sooner if risk factors   Prostate cancer screening: recommendations vary, optional PSA blood test form en around age 27  Infection screenings . HIV: recommended screening at least once age 11-65, more often if risk factors  . Gonorrhea/Chlamydia: many insurances require testing for anyone on birth control pills, otherwise screening as needed . Hepatitis C: recommended for anyone born 27-1965  Other . Bone Density Test: recommended for women at age 65, men at age 36 . Advanced Directive: Living Will and/or Healthcare Power of Attorney recommended for everyone, regardless of age or health . Cholesterol & Diabeted: recommended screening every few years for young, healthy folks . Thyroid and Vitamin D: routine screening not recommended, most insurance will not cover this test      Visit summary with medication list and pertinent instructions was printed for patient to review. All questions at time of visit were answered - patient instructed to contact office with any additional concerns. ER/RTC precautions were reviewed with the patient.    Follow-up plan: Return in about 1 year (around 02/07/2019) for annual wellness physical, sooner if needed (can see me or Dr Denyse Amass .   Please note: voice recognition software was used to produce this document, and typos may escape review. Please contact Dr. Lyn Hollingshead for any needed clarifications.

## 2018-06-18 ENCOUNTER — Ambulatory Visit (INDEPENDENT_AMBULATORY_CARE_PROVIDER_SITE_OTHER): Payer: No Typology Code available for payment source | Admitting: Family Medicine

## 2018-06-18 ENCOUNTER — Encounter: Payer: Self-pay | Admitting: Family Medicine

## 2018-06-18 VITALS — BP 138/68 | HR 76 | Ht 76.0 in | Wt 207.0 lb

## 2018-06-18 DIAGNOSIS — M25511 Pain in right shoulder: Secondary | ICD-10-CM

## 2018-06-18 DIAGNOSIS — Z23 Encounter for immunization: Secondary | ICD-10-CM

## 2018-06-18 NOTE — Patient Instructions (Signed)
Thank you for coming in today. Attend PT.  Work on home exercises.  Recheck in 1 month.  If not getting better next step is MRI.

## 2018-06-18 NOTE — Progress Notes (Signed)
Logan Mcguire is a 27 y.o. male, with a past history of right shoulder subluxation 5 years ago, who presents to [facility] today for right shoulder pain. In mid-July, Logan Mcguire was doing flys at the gym and felt a pull in his right shoulder. Since then, he has had pain and difficulty doing exercises at the gym that include pushing or weights overhead. He notes that any exercises with pulling is fine. This injury does not bother him at work. He has tried icing his shoulder and using "inflammation cream", without relief.  He is been doing some home exercise program which helps a little.  He is unable to do much lifting without pain.  He does note some clicking and popping and instability.  He notes he has a past history of shoulder subluxation or injury with lifting.  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   ROS:  As above  Exam:  BP 138/68   Pulse 76   Ht 6\' 4"  (1.93 m)   Wt 207 lb (93.9 kg)   BMI 25.20 kg/m  General: Well Developed, well nourished, and in no acute distress.  Neuro/Psych: Alert and oriented x3, extra-ocular muscles intact, able to move all 4 extremities, sensation grossly intact. Skin: Warm and dry, no rashes noted.  Respiratory: Not using accessory muscles, speaking in full sentences, trachea midline.  Cardiovascular: Pulses palpable, no extremity edema. Abdomen: Does not appear distended. MSK:  Right shoulder: Normal-appearing with no deformity. No tenderness to palpation over the [facility] joint. Normal ROM, but internal rotation reproduces pain. Strength intact to external and internal rotation. Strength intact to resisted abduction and adduction.  Negative crossover arm compression test Negative clunk test. Mildly positive empty can test. Negative anterior and posterior apprehension test. Popping and clicking and motion at [facility] joint with shoulder motion and  abduction.  Contralateral shoulder normal-appearing, nontender, normal motion, normal strength.   Pulses capillary fill sensation are intact distally BL UE.    Limited musculoskeletal ultrasound of right shoulder:  Biceps tendon normal in biceps groove. Tendons of supraspinatus, infraspinatus, and subscapularis visualized and normal-appearing. Mild thickened subacromial bursa Widened AC joint with mild effusion MD performed ultrasound exam   Assessment and Plan: 27 y.o. male with  Right shoulder pain: No obvious tears or deformities seen on ultrasound, except widening of AC joint, most likely from previous motocross injury. Advised doing physical therapy and at home shoulder exercises. Had in-depth discussion with patient about activities to avoid at the gym. Recommended focusing on strengthening back muscles. Follow-up in one month. If not improved, consider MRI arthrogram.   Flu vaccine given today prior to discharge.  Orders Placed This Encounter  Procedures  . Flu Vaccine QUAD 6+ mos PF IM (Fluarix Quad PF)  . Ambulatory referral to Physical Therapy    Referral Priority:   Routine    Referral Type:   Physical Medicine    Referral Reason:   Specialty Services Required    Requested Specialty:   Physical Therapy    Historical information moved to improve visibility of documentation.  Past Medical History:  Diagnosis Date  . History of high blood pressure   . History of high cholesterol    History reviewed. No pertinent surgical history. Social History   Tobacco Use  . Smoking status: Never Smoker  . Smokeless tobacco: Never Used  Substance Use Topics  . Alcohol use: Yes  Alcohol/week: 0.0 standard drinks    Comment: Socially   family history includes Diabetes in his maternal grandfather and maternal grandmother; Heart attack in his maternal grandfather and maternal grandmother; High Cholesterol in his maternal grandfather and maternal grandmother; High blood  pressure in his maternal grandfather and maternal grandmother; Lung cancer in his paternal grandfather; Prostate cancer in his paternal grandfather.  Medications: Current Outpatient Medications  Medication Sig Dispense Refill  . diclofenac sodium (VOLTAREN) 1 % GEL   11   No current facility-administered medications for this visit.    No Known Allergies   Discussed warning signs or symptoms. Please see discharge instructions. Patient expresses understanding.  I personally was present and performed or re-performed the history, physical exam and medical decision-making activities of this service and have verified that the service and findings are accurately documented in the student's note. ___________________________________________ Clementeen Graham M.D., ABFM., CAQSM. Primary Care and Sports Medicine Adjunct Instructor of Family Medicine  [employer]

## 2018-06-25 ENCOUNTER — Encounter: Payer: Self-pay | Admitting: Rehabilitative and Restorative Service Providers"

## 2018-06-25 ENCOUNTER — Ambulatory Visit (INDEPENDENT_AMBULATORY_CARE_PROVIDER_SITE_OTHER): Payer: No Typology Code available for payment source | Admitting: Rehabilitative and Restorative Service Providers"

## 2018-06-25 DIAGNOSIS — M25511 Pain in right shoulder: Secondary | ICD-10-CM

## 2018-06-25 DIAGNOSIS — R29898 Other symptoms and signs involving the musculoskeletal system: Secondary | ICD-10-CM | POA: Diagnosis not present

## 2018-06-25 DIAGNOSIS — R293 Abnormal posture: Secondary | ICD-10-CM | POA: Diagnosis not present

## 2018-06-25 NOTE — Therapy (Signed)
[facility] [place] [address] [place], [place], [address] Phone: [phone]   Fax:  [phone]  Physical Therapy Evaluation  Patient Details  Name: Logan Mcguire MRN: [id number] Date of Birth: [birth year] Referring Provider (PT): Dr Clementeen Graham    Encounter Date: 06/25/2018  PT End of Session - 06/25/18 0717    Visit Number  1    Number of Visits  12    Date for PT Re-Evaluation  08/06/18    PT Start Time  0715    PT Stop Time  0813    PT Time Calculation (min)  58 min    Activity Tolerance  Patient tolerated treatment well       Past Medical History:  Diagnosis Date  . History of high blood pressure   . History of high cholesterol     History reviewed. No pertinent surgical history.  There were no vitals filed for this visit.   Subjective Assessment - 06/25/18 0718    Subjective  Patient reports that he noticed a sudden onset of Rt shoulder pain when he felt a pull and immediate pain when he was doing a bench fly 03/03/18. He has had continued pain since that time. He reports that the pain has gotten worse but is no longer clicking     Pertinent History  Rt shoulder subluxation power lifting;  Lt knee pain; Lt medial elbow pain     Patient Stated Goals  get rid of pain and avoid surgery     Currently in Pain?  Yes    Pain Score  0-No pain   sitting - no pain; working out 6/10 with clicking    Pain Location  Shoulder    Pain Orientation  Right    Pain Descriptors / Indicators  Sharp;Dull;Aching   sharp with push - then dull ache    Pain Type  Acute pain    Pain Radiating Towards  into biceps deltoid area     Pain Onset  More than a month ago    Pain Frequency  Intermittent    Aggravating Factors   pushing - forward and overhead; reaching behind back     Pain Relieving Factors  avoiding activities; ice; cream          [facility] PT Assessment - 06/25/18 0001      Assessment   Medical Diagnosis  Rt shoulder  dysfunction     Referring Provider (PT)  Dr Clementeen Graham     Onset Date/Surgical Date  03/03/18    Hand Dominance  Right    Next MD Visit  PRN     Prior Therapy  yes for knee       Precautions   Precautions  None      Balance Screen   Has the patient fallen in the past 6 months  No    Has the patient had a decrease in activity level because of a fear of falling?   No    Is the patient reluctant to leave their home because of a fear of falling?   No      Prior Function   Level of Independence  Independent    Vocation  Full time employment    Vocation Requirements  desk/computer     Leisure  exercise daily cardio and wts      Observation/Other Assessments   Focus on Therapeutic Outcomes (FOTO)   41% limitation       Sensation   Additional  Comments  WNL's      Posture/Postural Control   Posture Comments  head forward; shoulders rounded and elevated; head of the humerus anterior inorientation; scapulae abducted and rotated along the thoracic wall       AROM   Right Shoulder Extension  67 Degrees    Right Shoulder Flexion  146 Degrees   pain    Right Shoulder ABduction  146 Degrees   pain    Right Shoulder Internal Rotation  100 Degrees    Right Shoulder External Rotation  55 Degrees    Left Shoulder Extension  50 Degrees    Left Shoulder Flexion  157 Degrees    Left Shoulder ABduction  158 Degrees    Left Shoulder Internal Rotation  45 Degrees    Left Shoulder External Rotation  90 Degrees      Strength   Overall Strength Comments  weaker middle and lower trap 5-/5 compared to anterior chest and upper traps 5/5       Palpation   Spinal mobility  WFL's     Palpation comment  muscular tightness Rt > Lt pecs; upper trap; leveator; teres - anterior Rt shoulder                 Objective measurements completed on examination: See above findings.      [facility] Adult PT Treatment/Exercise - 06/25/18 0001      Neuro Re-ed    Neuro Re-ed Details   working on posture  and alignment engaging posterior shoulder girdle lifting chest pulling shoulders down and back       Shoulder Exercises: Standing   Retraction  Strengthening;Both;10 reps   2-3 sec hold isometric yellow band    Other Standing Exercises  scap squeeze 10 sec x 10; L's x 10; W's x 10      Shoulder Exercises: Stretch   Other Shoulder Stretches  biceps stretch 30 sec x 2    Other Shoulder Stretches  3 way doorway stretch 30 sec x 2 each position      Cryotherapy   Number Minutes Cryotherapy  15 Minutes    Cryotherapy Location  Shoulder   Rt   Type of Cryotherapy  Ice pack      Electrical Stimulation   Electrical Stimulation Location  Rt shoulder girdle     Electrical Stimulation Action  IFC    Electrical Stimulation Parameters  to tolerance    Electrical Stimulation Goals  Pain;Tone             PT Education - 06/25/18 [phone]    Education Details  HEP     Person(s) Educated  Patient    Methods  Explanation;Demonstration;Tactile cues;Verbal cues;Handout    Comprehension  Verbalized understanding;Returned demonstration;Verbal cues required;Tactile cues required          PT Long Term Goals - 06/25/18 0838      PT LONG TERM GOAL #1   Title  Improve posture and alignment with patient to demonstrate improved upright posture with posterior shoulder girdle engaged 08/06/18    Time  6    Period  Weeks    Status  New      PT LONG TERM GOAL #2   Title  Decrease Rt shoulder pain with functional activities by 75-80% including pushing and lifting activities 08/06/18    Time  6    Period  Weeks    Status  New      PT LONG TERM GOAL #3   Title  Increase posterior shoulder girdle strength with patient demonstrating improved muscular balance with functional activities 08/06/18    Time  6    Period  Weeks    Status  New      PT LONG TERM GOAL #4   Title  Independent in HEP 08/06/18    Time  6    Period  Weeks    Status  New      PT LONG TERM GOAL #5   Title  Improve FOTO to  </= 22% limitation 08/06/18    Time  6    Period  Weeks    Status  New             Plan - 06/25/18 0829    Clinical Impression Statement  Logan Mcguire presents with 4 month history of Rt shoulder pain following injury while lifting(fly bench press) 03/03/18. He has had persistent pain in the anterior and inferior Rt shoulder with pushing/lifting activities as well as a popping sensatiion at times. Patient presents with abnormal posture with shoulders rounded and elevated, head of the humerus anterior in orientation, scapulae abducted and rotated along the thoracic wall. Patient has poor movement patterns with elevation of shoulders bilaterally and unlaterally; muscular imbalance with pecs and upper traps overworking; decreased strength posterior shoulder girdle musculature; pain with functional activities; decreased AROM Rt shoulder > Lt; weakness middle and lower trap. Patient will benefit from PT to address problems identified.     History and Personal Factors relevant to plan of care:  prior injuries to Rt shoulder including AC joint separation; 'subluxation" of Rt shoulder; long standing postural changes and muscular imbalance     Clinical Presentation  Stable    Clinical Decision Making  Low    Rehab Potential  Good    PT Frequency  2x / week    PT Duration  6 weeks    PT Treatment/Interventions  Patient/family education;ADLs/Self Care Home Management;Cryotherapy;Electrical Stimulation;Iontophoresis 4mg /ml Dexamethasone;Moist Heat;Ultrasound;Dry needling;Manual techniques;Neuromuscular re-education;Therapeutic activities;Therapeutic exercise    PT Next Visit Plan  review HEP; consider DN pecs/trap/teres; work scapular depression/posterior shoulder girdle musculature; stretch pecs(add snow angel with trunk rotation); modalities as indicated     Consulted and Agree with Plan of Care  Patient       Patient will benefit from skilled therapeutic intervention in order to improve the following  deficits and impairments:  Postural dysfunction, Improper body mechanics, Pain, Increased fascial restricitons, Increased muscle spasms, Decreased mobility, Decreased range of motion, Decreased strength, Decreased activity tolerance  Visit Diagnosis: Acute pain of right shoulder - Plan: PT plan of care cert/re-cert  Other symptoms and signs involving the musculoskeletal system - Plan: PT plan of care cert/re-cert  Abnormal posture - Plan: PT plan of care cert/re-cert     Problem List Patient Active Problem List   Diagnosis Date Noted  . Quadriceps tendonitis 12/04/2017  . Medial epicondylitis 12/04/2017  . Left knee pain 09/13/2017  . Left lower quadrant abdominal pain of unknown etiology 09/13/2017    Infant Zink Rober Minion PT, MPH  06/25/2018, 8:43 AM  [facility] [address] [place], [place], [address] Phone: [phone]   Fax:  [phone]  Name: Logan Mcguire MRN: [id number] Date of Birth: 02-12-1991

## 2018-06-25 NOTE — Patient Instructions (Signed)
Shoulder Blade Squeeze    Rotate shoulders back, then squeeze shoulder blades down and back  Hold 10 sec Repeat __10__ times. Do __several __ sessions per day.  Upper Back Strength: Lower Trapezius / Rotator Cuff " L's "     Arms in waitress pose, palms up. Press hands back and slide shoulder blades down. Hold for __5__ seconds. Repeat _10___ times. 1-2 times per day.    Scapular Retraction: Elbow Flexion (Standing)  "W's"     With elbows bent to 90, pinch shoulder blades together and rotate arms out, keeping elbows bent. Repeat __10__ times per set. Do __1-2__ sets per session. Do _several ___ sessions per day.  Resisted External Rotation: in Neutral - Bilateral   Sit or stand, tubing in both hands, elbows at sides, bent to 90, forearms forward. Pinch shoulder blades together and tense band. Keep elbows at sides. Hold 3-5 sec Repeat __10__ times per set. Do _2-3___ sets per session. Do _2-3___ sessions per day.  Scapula Adduction With Pectoralis Stretch: Low - Standing   Shoulders at 45 hands even with shoulders, keeping weight through legs, shift weight forward until you feel pull or stretch through the front of your chest. Hold _30__ seconds. Do _3__ times, _2-4__ times per day.   Scapula Adduction With Pectoralis Stretch: Mid-Range - Standing   Shoulders at 90 elbows even with shoulders, keeping weight through legs, shift weight forward until you feel pull or strength through the front of your chest. Hold __30_ seconds. Do _3__ times, __2-4_ times per day.   Scapula Adduction With Pectoralis Stretch: High - Standing   Shoulders at 120 hands up high on the doorway, keeping weight on feet, shift weight forward until you feel pull or stretch through the front of your chest. Hold _30__ seconds. Do _3__ times, _2-3__ times per day.   ELBOW: Biceps - Standing    Standing in doorway, place one hand on wall, elbow straight. Lean forward. Hold _30__  seconds. __3_ reps per set, __2-3_ sets per day  Trigger Point Dry Needling  . What is Trigger Point Dry Needling (DN)? o DN is a physical therapy technique used to treat muscle pain and dysfunction. Specifically, DN helps deactivate muscle trigger points (muscle knots).  o A thin filiform needle is used to penetrate the skin and stimulate the underlying trigger point. The goal is for a local twitch response (LTR) to occur and for the trigger point to relax. No medication of any kind is injected during the procedure.   . What Does Trigger Point Dry Needling Feel Like?  o The procedure feels different for each individual patient. Some patients report that they do not actually feel the needle enter the skin and overall the process is not painful. Very mild bleeding may occur. However, many patients feel a deep cramping in the muscle in which the needle was inserted. This is the local twitch response.   Marland Kitchen How Will I feel after the treatment? o Soreness is normal, and the onset of soreness may not occur for a few hours. Typically this soreness does not last longer than two days.  o Bruising is uncommon, however; ice can be used to decrease any possible bruising.  o In rare cases feeling tired or nauseous after the treatment is normal. In addition, your symptoms may get worse before they get better, this period will typically not last longer than 24 hours.   . What Can I do After My Treatment? o Increase your  hydration by drinking more water for the next 24 hours. o You may place ice or heat on the areas treated that have become sore, however, do not use heat on inflamed or bruised areas. Heat often brings more relief post needling. o You can continue your regular activities, but vigorous activity is not recommended initially after the treatment for 24 hours. o DN is best combined with other physical therapy such as strengthening, stretching, and other therapies.    [facility] at  [facility] [address] [place], [place] [address]  [phone] (office) [phone] (fax)

## 2018-07-02 ENCOUNTER — Encounter: Payer: Self-pay | Admitting: Rehabilitative and Restorative Service Providers"

## 2018-07-02 ENCOUNTER — Ambulatory Visit: Payer: No Typology Code available for payment source | Admitting: Rehabilitative and Restorative Service Providers"

## 2018-07-02 DIAGNOSIS — R29898 Other symptoms and signs involving the musculoskeletal system: Secondary | ICD-10-CM | POA: Diagnosis not present

## 2018-07-02 DIAGNOSIS — R293 Abnormal posture: Secondary | ICD-10-CM

## 2018-07-02 DIAGNOSIS — M25511 Pain in right shoulder: Secondary | ICD-10-CM | POA: Diagnosis not present

## 2018-07-02 DIAGNOSIS — M25562 Pain in left knee: Secondary | ICD-10-CM | POA: Diagnosis not present

## 2018-07-02 NOTE — Patient Instructions (Addendum)
All prone exercises - keep chin tucked - start with 1-2 sets of 10 and progress to 2-3 sets   Shoulder Blade Squeeze: Arms at Sides    Arms at sides, parallel, elbows straight, palms up. Press pelvis down. Squeeze backbone with shoulder blades, raising front of shoulders, chest, and arms. Keep head and neck neutral. Hold ___ seconds. Relax. Repeat ___ times.   Shoulder Blade Squeeze: W    Arms out to sides at 90 palms down. Bend elbows to 90. Press pelvis down. Squeeze backbone with shoulder blades. Raise arms, front of shoulders, chest, and head. Keep neck neutral. Hold ___ seconds. Relax. Repeat ___ times.   Shoulder Blade Squeeze: Airplane    Arms out to sides at 90, elbows straight, palms down. Press pelvis down. Squeeze backbone with shoulder blades. Raise arms, front of shoulders, chest, and head. Keep neck neutral. Hold ___ seconds. Relax. Repeat ___ times.   Shoulder Blade Squeeze: Superperson    Arms alongside head, elbows straight, palms down. Press pelvis down. Squeeze backbone with shoulder blades. Raise arms, chest, and head. Keep neck neutral. Hold ___ seconds. Relax. Repeat ___ times.   Isometric middle trap using blue band hands in front  2-3 sets of 10   Add lat stretch arms straight over head stepping through the doorway  30 sec x 3   Prolonged snow angel stretch - 3-5 min NO PAIN   Trigger Point Dry Needling  . What is Trigger Point Dry Needling (DN)? o DN is a physical therapy technique used to treat muscle pain and dysfunction. Specifically, DN helps deactivate muscle trigger points (muscle knots).  o A thin filiform needle is used to penetrate the skin and stimulate the underlying trigger point. The goal is for a local twitch response (LTR) to occur and for the trigger point to relax. No medication of any kind is injected during the procedure.   . What Does Trigger Point Dry Needling Feel Like?  o The procedure feels different for each  individual patient. Some patients report that they do not actually feel the needle enter the skin and overall the process is not painful. Very mild bleeding may occur. However, many patients feel a deep cramping in the muscle in which the needle was inserted. This is the local twitch response.   Marland Kitchen. How Will I feel after the treatment? o Soreness is normal, and the onset of soreness may not occur for a few hours. Typically this soreness does not last longer than two days.  o Bruising is uncommon, however; ice can be used to decrease any possible bruising.  o In rare cases feeling tired or nauseous after the treatment is normal. In addition, your symptoms may get worse before they get better, this period will typically not last longer than 24 hours.   . What Can I do After My Treatment? o Increase your hydration by drinking more water for the next 24 hours. o You may place ice or heat on the areas treated that have become sore, however, do not use heat on inflamed or bruised areas. Heat often brings more relief post needling. o You can continue your regular activities, but vigorous activity is not recommended initially after the treatment for 24 hours. o DN is best combined with other physical therapy such as strengthening, stretching, and other therapies.

## 2018-07-02 NOTE — Therapy (Signed)
[facility] [address] [place], [place], [address] Phone: [phone]   Fax:  [phone]  Physical Therapy Treatment  Patient Details  Name: Logan Mcguire MRN: [id number] Date of Birth: 1990-09-02 Referring Provider (PT): Dr Clementeen GrahamEvan Corey    Encounter Date: 07/02/2018  PT End of Session - 07/02/18 0712    Visit Number  2    Number of Visits  12    Date for PT Re-Evaluation  08/06/18    PT Start Time  0713    PT Stop Time  0808    PT Time Calculation (min)  55 min    Activity Tolerance  Patient tolerated treatment well       Past Medical History:  Diagnosis Date  . History of high blood pressure   . History of high cholesterol     History reviewed. No pertinent surgical history.  There were no vitals filed for this visit.  Subjective Assessment - 07/02/18 0716    Subjective  Rece reports that the PT may have irritated the pain in the Rt shoulder. He has some increased painin the anterior Rt chest.     Currently in Pain?  Yes    Pain Score  3     Pain Location  Shoulder    Pain Orientation  Anterior    Pain Descriptors / Indicators  Aching;Dull    Pain Type  Acute pain                       [facility] Adult PT Treatment/Exercise - 07/02/18 0001      Shoulder Exercises: Prone   Other Prone Exercises  prone extension x10; prone w's x 10; airplane x10; superman x 10       Shoulder Exercises: Standing   Other Standing Exercises  scap squeeze 10 sec x 10; L's x 10; W's x 10      Shoulder Exercises: Stretch   Star Gazer Stretch  --   lat stretch 30 sec x 2 PT assist for position Rt UE    Other Shoulder Stretches  biceps stretch 30 sec x 2    Other Shoulder Stretches  3 way doorway stretch 30 sec x 2 each position      Manual Therapy   Manual therapy comments  pt supine and prone     Soft tissue mobilization  Rt pecs/biceps/traps/lats/teres     Myofascial Release  anterior chest     Passive ROM  Rt  shd     Kinesiotex  Inhibit Muscle   upper trap bilat       Trigger Point Dry Needling - 07/02/18 1413    Consent Given?  Yes    Education Handout Provided  Yes    Muscles Treated Upper Body  Pectoralis major;Pectoralis minor;Levator scapulae;Upper trapezius   Rt with teres - estim    Upper Trapezius Response  Palpable increased muscle length;Twitch reponse elicited    Pectoralis Major Response  Palpable increased muscle length;Twitch response elicited    Pectoralis Minor Response  Palpable increased muscle length;Twitch response elicited    Levator Scapulae Response  Palpable increased muscle length;Twitch response elicited           PT Education - 07/02/18 0804    Education Details  HEP DN    Person(s) Educated  Patient    Methods  Explanation;Demonstration;Tactile cues;Verbal cues;Handout    Comprehension  Verbalized understanding;Returned demonstration;Verbal cues required;Tactile cues required  PT Long Term Goals - 06/25/18 0838      PT LONG TERM GOAL #1   Title  Improve posture and alignment with patient to demonstrate improved upright posture with posterior shoulder girdle engaged 08/06/18    Time  6    Period  Weeks    Status  New      PT LONG TERM GOAL #2   Title  Decrease Rt shoulder pain with functional activities by 75-80% including pushing and lifting activities 08/06/18    Time  6    Period  Weeks    Status  New      PT LONG TERM GOAL #3   Title  Increase posterior shoulder girdle strength with patient demonstrating improved muscular balance with functional activities 08/06/18    Time  6    Period  Weeks    Status  New      PT LONG TERM GOAL #4   Title  Independent in HEP 08/06/18    Time  6    Period  Weeks    Status  New      PT LONG TERM GOAL #5   Title  Improve FOTO to </= 22% limitation 08/06/18    Time  6    Period  Weeks    Status  New            Plan - 07/02/18 0981    Clinical Impression Statement  Increased pain  - possibly from stretching. Added posterior shoulder girdle strengthening without difficulty. Tolerated DN. Continues to have significatn tightness through the anterior Rt shoulder.     Rehab Potential  Good    PT Frequency  2x / week    PT Duration  6 weeks    PT Treatment/Interventions  Patient/family education;ADLs/Self Care Home Management;Cryotherapy;Electrical Stimulation;Iontophoresis 4mg /ml Dexamethasone;Moist Heat;Ultrasound;Dry needling;Manual techniques;Neuromuscular re-education;Therapeutic activities;Therapeutic exercise    PT Next Visit Plan  review HEP; consider DN pecs/trap/teres; work scapular depression/posterior shoulder girdle musculature; stretch pecs(add snow angel with trunk rotation), trial of wall shd stretches; ball dorsum of hand; modalities as indicated     Consulted and Agree with Plan of Care  Patient       Patient will benefit from skilled therapeutic intervention in order to improve the following deficits and impairments:  Postural dysfunction, Improper body mechanics, Pain, Increased fascial restricitons, Increased muscle spasms, Decreased mobility, Decreased range of motion, Decreased strength, Decreased activity tolerance  Visit Diagnosis: Acute pain of right shoulder  Other symptoms and signs involving the musculoskeletal system  Abnormal posture  Acute pain of left knee     Problem List Patient Active Problem List   Diagnosis Date Noted  . Quadriceps tendonitis 12/04/2017  . Medial epicondylitis 12/04/2017  . Left knee pain 09/13/2017  . Left lower quadrant abdominal pain of unknown etiology 09/13/2017    [name] PT, MPH  07/02/2018, 2:17 PM  [facility] [address] [place], [place], [address] Phone: [phone]   Fax:  [phone]  Name: Logan Mcguire MRN: [id number] Date of Birth: [birth year]

## 2018-07-09 ENCOUNTER — Ambulatory Visit: Payer: No Typology Code available for payment source | Admitting: Rehabilitative and Restorative Service Providers"

## 2018-07-09 ENCOUNTER — Encounter: Payer: Self-pay | Admitting: Rehabilitative and Restorative Service Providers"

## 2018-07-09 DIAGNOSIS — M25562 Pain in left knee: Secondary | ICD-10-CM | POA: Diagnosis not present

## 2018-07-09 DIAGNOSIS — R29898 Other symptoms and signs involving the musculoskeletal system: Secondary | ICD-10-CM

## 2018-07-09 DIAGNOSIS — M25511 Pain in right shoulder: Secondary | ICD-10-CM

## 2018-07-09 DIAGNOSIS — R293 Abnormal posture: Secondary | ICD-10-CM

## 2018-07-09 NOTE — Therapy (Signed)
[facility] [address] [place], [place], [address] Phone: [phone]   Fax:  [phone]  Physical Therapy Treatment  Patient Details  Name: Logan Mcguire MRN: [id number] Date of Birth: [birth year] Referring Provider (PT): Dr Clementeen GrahamEvan Corey    Encounter Date: 07/09/2018  PT End of Session - 07/09/18 0713    Visit Number  3    Number of Visits  12    Date for PT Re-Evaluation  08/06/18    PT Start Time  0710    PT Stop Time  0807    PT Time Calculation (min)  57 min    Activity Tolerance  Patient tolerated treatment well       Past Medical History:  Diagnosis Date  . History of high blood pressure   . History of high cholesterol     History reviewed. No pertinent surgical history.  There were no vitals filed for this visit.  Subjective Assessment - 07/09/18 0714    Subjective  Logan Mcguire reports progress with shoulder - having less pain and can sleep on the Rt side without pain. He feels the most benefit is from the massage and release work he does with the ball and a massager he has as well sa stretching - trying not to over stretch now.     Currently in Pain?  No/denies    Pain Score  0-No pain         [facility] PT Assessment - 07/09/18 0001      Assessment   Medical Diagnosis  Rt shoulder dysfunction     Referring Provider (PT)  Dr Clementeen GrahamEvan Corey     Onset Date/Surgical Date  03/03/18    Hand Dominance  Right    Next MD Visit  PRN     Prior Therapy  yes for knee       AROM   Right Shoulder Flexion  161 Degrees    Right Shoulder ABduction  167 Degrees      Palpation   Palpation comment  muscular tightness Rt > Lt pecs; upper trap; leveator; teres - anterior Rt shoulder                    [facility] Adult PT Treatment/Exercise - 07/09/18 0001      Shoulder Exercises: Stretch   Star Gazer Stretch  --   lat stretch standing    Other Shoulder Stretches  biceps stretch 30 sec x 2    Other Shoulder Stretches   3 way doorway stretch 30 sec x 2 each position      Moist Heat Therapy   Number Minutes Moist Heat  15 Minutes    Moist Heat Location  Shoulder   Rt     Electrical Stimulation   Electrical Stimulation Location  Rt shoulder girdle     Electrical Stimulation Action  IFC    Electrical Stimulation Parameters  to tolerance    Electrical Stimulation Goals  Pain;Tone      Manual Therapy   Manual therapy comments  pt supine and prone     Soft tissue mobilization  Rt pecs/biceps/traps/lats/teres     Myofascial Release  anterior chest     Passive ROM  Rt shd        Trigger Point Dry Needling - 07/09/18 0808    Consent Given?  Yes    Upper Trapezius Response  Palpable increased muscle length    Pectoralis Major Response  Palpable increased muscle length  Pectoralis Minor Response  Palpable increased muscle length    Levator Scapulae Response  Palpable increased muscle length                PT Long Term Goals - 06/25/18 0838      PT LONG TERM GOAL #1   Title  Improve posture and alignment with patient to demonstrate improved upright posture with posterior shoulder girdle engaged 08/06/18    Time  6    Period  Weeks    Status  New      PT LONG TERM GOAL #2   Title  Decrease Rt shoulder pain with functional activities by 75-80% including pushing and lifting activities 08/06/18    Time  6    Period  Weeks    Status  New      PT LONG TERM GOAL #3   Title  Increase posterior shoulder girdle strength with patient demonstrating improved muscular balance with functional activities 08/06/18    Time  6    Period  Weeks    Status  New      PT LONG TERM GOAL #4   Title  Independent in HEP 08/06/18    Time  6    Period  Weeks    Status  New      PT LONG TERM GOAL #5   Title  Improve FOTO to </= 22% limitation 08/06/18    Time  6    Period  Weeks    Status  New            Plan - 07/09/18 0714    Clinical Impression Statement  Improving. Less pain and increased  ROM. Responded well to DN and stretching. Progressing toward stated goals of therapy.     Rehab Potential  Good    PT Frequency  2x / week    PT Duration  6 weeks    PT Treatment/Interventions  Patient/family education;ADLs/Self Care Home Management;Cryotherapy;Electrical Stimulation;Iontophoresis 4mg /ml Dexamethasone;Moist Heat;Ultrasound;Dry needling;Manual techniques;Neuromuscular re-education;Therapeutic activities;Therapeutic exercise    PT Next Visit Plan  review HEP; consider DN pecs/trap/teres; work scapular depression/posterior shoulder girdle musculature; stretch pecs(add snow angel with trunk rotation), trial of wall shd stretches; ball dorsum of hand; modalities as indicated     Consulted and Agree with Plan of Care  Patient       Patient will benefit from skilled therapeutic intervention in order to improve the following deficits and impairments:  Postural dysfunction, Improper body mechanics, Pain, Increased fascial restricitons, Increased muscle spasms, Decreased mobility, Decreased range of motion, Decreased strength, Decreased activity tolerance  Visit Diagnosis: Acute pain of right shoulder  Other symptoms and signs involving the musculoskeletal system  Abnormal posture  Acute pain of left knee     Problem List Patient Active Problem List   Diagnosis Date Noted  . Quadriceps tendonitis 12/04/2017  . Medial epicondylitis 12/04/2017  . Left knee pain 09/13/2017  . Left lower quadrant abdominal pain of unknown etiology 09/13/2017     Rober Minion PT, MPH  07/09/2018, 8:10 AM  [facility] [address] [place], [place], [address] Phone: [phone]   Fax:  [phone]  Name: Logan Mcguire MRN: [id number] Date of Birth: [birth year]

## 2018-07-16 ENCOUNTER — Ambulatory Visit: Payer: No Typology Code available for payment source | Admitting: Physical Therapy

## 2018-07-16 ENCOUNTER — Encounter: Payer: Self-pay | Admitting: Physical Therapy

## 2018-07-16 DIAGNOSIS — R293 Abnormal posture: Secondary | ICD-10-CM

## 2018-07-16 DIAGNOSIS — R29898 Other symptoms and signs involving the musculoskeletal system: Secondary | ICD-10-CM

## 2018-07-16 DIAGNOSIS — M25511 Pain in right shoulder: Secondary | ICD-10-CM | POA: Diagnosis not present

## 2018-07-16 NOTE — Therapy (Signed)
[facility] [address] [place], [place], [address] Phone: [phone]   Fax:  [phone]  Physical Therapy Treatment  Patient Details  Name: Logan Mcguire MRN: [id number] Date of Birth: Mar 14, 1991 Referring Provider (PT): Dr Clementeen GrahamEvan Corey    Encounter Date: 07/16/2018  PT End of Session - 07/16/18 0832    Visit Number  4    Number of Visits  12    Date for PT Re-Evaluation  08/06/18    PT Start Time  0716    PT Stop Time  0800    PT Time Calculation (min)  44 min    Activity Tolerance  Patient tolerated treatment well    Behavior During Therapy  [facility] for tasks assessed/performed       Past Medical History:  Diagnosis Date  . History of high blood pressure   . History of high cholesterol     History reviewed. No pertinent surgical history.  There were no vitals filed for this visit.  Subjective Assessment - 07/16/18 0726    Subjective  Pt reports no changes since last visit. He continues to do his exercises daily, use theragun 3x/day and heat 1x/day.     Currently in Pain?  Yes    Pain Score  3     Pain Location  Shoulder    Pain Orientation  Right;Anterior    Pain Descriptors / Indicators  Aching;Dull    Aggravating Factors   reaching behind back     Pain Relieving Factors  theragun and stretches.        [facility] Adult [facility] Treatment/Exercise - 07/16/18 0001      Exercises   Exercises  Shoulder      Shoulder Exercises: Prone   Other Prone Exercises  prone T's with 1# x 10; W's with 1# x 10, unilateral shoulder flex with 1# x 10 each arm; goal posts x 10 reps with 1#        Shoulder Exercises: ROM/Strengthening   UBE (Upper Arm Bike)  L2: 2 min backward, 1.5 min forward (standing)      Shoulder Exercises: Stretch   Other Shoulder Stretches  bicep stretch with hands behind back (strap assist) x 15 x 4 reps; horiz abdct stretch (hand on door) x 30 sec x 2 reps    Other Shoulder Stretches  3 way doorway stretch 30  sec x 2 each position; arms overhead x 30 sec x 2       Modalities   Modalities  Ultrasound;Electrical Stimulation      Electrical Stimulation   Electrical Stimulation Location  Rt ant shoulder     Electrical Stimulation Action  combo US    Electrical Stimulation Parameters  to pt tolerance (combo-premod setting)    Electrical Stimulation Goals  Pain;Tone      Ultrasound   Ultrasound Location  see estim    Ultrasound Parameters  100%, 1.3 w/cm2, 1.780mHz, 8 min     Ultrasound Goals  Pain   tightness     Manual Therapy   Manual Therapy  Soft tissue mobilization;Taping    Soft tissue mobilization  IASTM with edge tool to Rt coracobrachialis, ant and mid deltoid, prox bicep, pec muscle to decrease fascial restrrictions, improve ROM    Kinesiotex  IT consultantCreate Space      Kinesiotix   Create Space  I strip of reg Rock tape applied over Rt coracobrachialis and mid deltoid to increase proprioception and decompress tissue with 15% stretch;  perpendicular  small (power) strips applied over coracobrachialis insertion and pec maj insertion (75% stretch) to decompress area and decrease pain.                  PT Long Term Goals - 06/25/18 0838      PT LONG TERM GOAL #1   Title  Improve posture and alignment with patient to demonstrate improved upright posture with posterior shoulder girdle engaged 08/06/18    Time  6    Period  Weeks    Status  New      PT LONG TERM GOAL #2   Title  Decrease Rt shoulder pain with functional activities by 75-80% including pushing and lifting activities 08/06/18    Time  6    Period  Weeks    Status  New      PT LONG TERM GOAL #3   Title  Increase posterior shoulder girdle strength with patient demonstrating improved muscular balance with functional activities 08/06/18    Time  6    Period  Weeks    Status  New      PT LONG TERM GOAL #4   Title  Independent in HEP 08/06/18    Time  6    Period  Weeks    Status  New      PT LONG TERM GOAL #5    Title  Improve FOTO to </= 22% limitation 08/06/18    Time  6    Period  Weeks    Status  New            Plan - 07/16/18 0829    Clinical Impression Statement  Pt continues to have discomfort in Rt anterior shoulder with shoulder IR, hor add and end range flexion.  He tolerated new stretches and prone exercises with light weight without any increase in symptoms.  Trial of combo [place], IASTM and tape to Rt anterior shoulder.  Pt reported elimination of pain at end of session.  Progressing towards goals.     Rehab Potential  Good    PT Frequency  2x / week    PT Duration  6 weeks    PT Treatment/Interventions  Patient/family education;ADLs/Self Care Home Management;Cryotherapy;Electrical Stimulation;Iontophoresis 4mg /ml Dexamethasone;Moist Heat;Ultrasound;Dry needling;Manual techniques;Neuromuscular re-education;Therapeutic activities;Therapeutic exercise    PT Next Visit Plan  assess response to [facility] tape.     Consulted and Agree with Plan of Care  Patient       Patient will benefit from skilled therapeutic intervention in order to improve the following deficits and impairments:  Postural dysfunction, Improper body mechanics, Pain, Increased fascial restricitons, Increased muscle spasms, Decreased mobility, Decreased range of motion, Decreased strength, Decreased activity tolerance  Visit Diagnosis: Acute pain of right shoulder  Other symptoms and signs involving the musculoskeletal system  Abnormal posture     Problem List Patient Active Problem List   Diagnosis Date Noted  . Quadriceps tendonitis 12/04/2017  . Medial epicondylitis 12/04/2017  . Left knee pain 09/13/2017  . Left lower quadrant abdominal pain of unknown etiology 09/13/2017   Mayer Camel, PTA 07/16/18 8:37 AM  [facility] [address] [place], [place], [address] Phone: [phone]   Fax:  [phone]  Name: Logan Mcguire MRN:  [id number] Date of Birth: 08-21-1990

## 2018-07-23 ENCOUNTER — Ambulatory Visit: Payer: No Typology Code available for payment source | Admitting: Rehabilitative and Restorative Service Providers"

## 2018-07-23 ENCOUNTER — Encounter: Payer: Self-pay | Admitting: Family Medicine

## 2018-07-23 DIAGNOSIS — M25562 Pain in left knee: Secondary | ICD-10-CM | POA: Diagnosis not present

## 2018-07-23 DIAGNOSIS — M25511 Pain in right shoulder: Secondary | ICD-10-CM

## 2018-07-23 DIAGNOSIS — R293 Abnormal posture: Secondary | ICD-10-CM

## 2018-07-23 DIAGNOSIS — R29898 Other symptoms and signs involving the musculoskeletal system: Secondary | ICD-10-CM | POA: Diagnosis not present

## 2018-07-23 NOTE — Therapy (Signed)
[facility] [address] [place], [place], [address] Phone: [phone]   Fax:  [phone]  Physical Therapy Treatment  Patient Details  Name: Logan Mcguire MRN: [id number] Date of Birth: [birth year] Referring Provider (PT): Dr Clementeen GrahamEvan Corey    Encounter Date: 07/23/2018  PT End of Session - 07/23/18 0824    Visit Number  5    Number of Visits  12    Date for PT Re-Evaluation  08/06/18    PT Start Time  0714    PT Stop Time  0812   moist heat end of treatment    PT Time Calculation (min)  58 min    Activity Tolerance  Patient tolerated treatment well       Past Medical History:  Diagnosis Date  . History of high blood pressure   . History of high cholesterol     No past surgical history on file.  There were no vitals filed for this visit.  Subjective Assessment - 07/23/18 0809    Subjective  Helped a friend move Saturday. Can tell everything tightened up. Feels the estim US combo helped some. Still tight in the anterior shoulder area. ROM is improved and it doesn't hurt like it did but still can't return to lifting like he wants.     Currently in Pain?  Yes    Pain Score  3     Pain Location  Shoulder    Pain Orientation  Right;Anterior    Pain Descriptors / Indicators  Aching;Dull    Pain Type  Acute pain    Pain Onset  More than a month ago    Pain Frequency  Intermittent         [facility] PT Assessment - 07/23/18 0001      Assessment   Medical Diagnosis  Rt shoulder dysfunction     Referring Provider (PT)  Dr Clementeen GrahamEvan Corey     Onset Date/Surgical Date  03/03/18    Hand Dominance  Right    Next MD Visit  PRN     Prior Therapy  yes for knee       AROM   Right Shoulder Flexion  160 Degrees    Right Shoulder ABduction  165 Degrees      Palpation   Palpation comment  muscular tightness Rt > Lt pecs; upper trap; leveator; teres - anterior Rt shoulder                    [facility] Adult PT  Treatment/Exercise - 07/23/18 0001      Shoulder Exercises: Stretch   Wall Stretch - ABduction  2 reps;30 seconds    Other Shoulder Stretches  3 way doorway stretch 30 sec x 2 each position; arms overhead x 30 sec x 2       Modalities   Modalities  Ultrasound;Electrical Stimulation      Moist Heat Therapy   Number Minutes Moist Heat  12 Minutes    Moist Heat Location  Shoulder   Rt anterior      Electrical Stimulation   Electrical Stimulation Location  Rt ant shoulder     Electrical Stimulation Action  combo US    Electrical Stimulation Parameters  to tolerance    Electrical Stimulation Goals  Pain;Tone      Ultrasound   Ultrasound Location  anterior shoulder/biceps     Ultrasound Parameters  100%; 1.5 w/cm2; 1 mHz; 8 min     Ultrasound Goals  Pain   tightness     Manual Therapy   Manual Therapy  Soft tissue mobilization;Taping    Manual therapy comments  pt supine     Soft tissue mobilization  deep tissue work through the pecs; biceps; brachialis; anterior deltoid     Myofascial Release  anterior chest     Passive ROM  Rt shoulder extension with elbow extension; horozontal abduction with elbow extension; pec stretch with trunk rotation                   PT Long Term Goals - 06/25/18 0838      PT LONG TERM GOAL #1   Title  Improve posture and alignment with patient to demonstrate improved upright posture with posterior shoulder girdle engaged 08/06/18    Time  6    Period  Weeks    Status  New      PT LONG TERM GOAL #2   Title  Decrease Rt shoulder pain with functional activities by 75-80% including pushing and lifting activities 08/06/18    Time  6    Period  Weeks    Status  New      PT LONG TERM GOAL #3   Title  Increase posterior shoulder girdle strength with patient demonstrating improved muscular balance with functional activities 08/06/18    Time  6    Period  Weeks    Status  New      PT LONG TERM GOAL #4   Title  Independent in HEP 08/06/18     Time  6    Period  Weeks    Status  New      PT LONG TERM GOAL #5   Title  Improve FOTO to </= 22% limitation 08/06/18    Time  6    Period  Weeks    Status  New            Plan - 07/23/18 0825    Clinical Impression Statement  Increased tightness and discomfort from helping a friend move Saturday - a lot of lifting. Can tell he has improved but is still not able to do things he wants to do with workouts/lifting at home. Patient demonstrates increased AROM Rt shoulder. He has decreasing palpable tightness thorugh the anterior Rt shoulder but persistent tightness. He has muscular imbalance and muscular tightness through the anterior shoulder into upper traps. Progressing gradually toward goals of rehab. Pt is considering MRI to see if there is something we are missing.     Rehab Potential  Good    PT Frequency  2x / week    PT Duration  6 weeks    PT Treatment/Interventions  Patient/family education;ADLs/Self Care Home Management;Cryotherapy;Electrical Stimulation;Iontophoresis 4mg /ml Dexamethasone;Moist Heat;Ultrasound;Dry needling;Manual techniques;Neuromuscular re-education;Therapeutic activities;Therapeutic exercise    PT Next Visit Plan  continue with stretching; manual work; posterior shoulder girdle strengthening; modalities as indicated     Consulted and Agree with Plan of Care  Patient       Patient will benefit from skilled therapeutic intervention in order to improve the following deficits and impairments:  Postural dysfunction, Improper body mechanics, Pain, Increased fascial restricitons, Increased muscle spasms, Decreased mobility, Decreased range of motion, Decreased strength, Decreased activity tolerance  Visit Diagnosis: Acute pain of right shoulder  Other symptoms and signs involving the musculoskeletal system  Abnormal posture  Acute pain of left knee     Problem List Patient Active Problem List   Diagnosis Date Noted  . Quadriceps tendonitis  12/04/2017  . Medial epicondylitis 12/04/2017  . Left knee pain 09/13/2017  . Left lower quadrant abdominal pain of unknown etiology 09/13/2017    Logan Provencio Rober Minion PT, MPH  07/23/2018, 8:30 AM  [facility] [address] [place], [place], [address] Phone: [phone]   Fax:  [phone]  Name: Toddy Boyd MRN: [id number] Date of Birth: 05/10/1991

## 2018-07-24 ENCOUNTER — Ambulatory Visit (INDEPENDENT_AMBULATORY_CARE_PROVIDER_SITE_OTHER): Payer: No Typology Code available for payment source

## 2018-07-24 DIAGNOSIS — M25511 Pain in right shoulder: Secondary | ICD-10-CM | POA: Diagnosis not present

## 2018-07-24 NOTE — Telephone Encounter (Signed)
I called patient and had a lengthy discussion.  He is still having quite significant dysfunction.  Plan for x-ray of shoulder in preparation for MRI arthrogram.

## 2018-07-25 ENCOUNTER — Telehealth: Payer: Self-pay | Admitting: Family Medicine

## 2018-07-25 DIAGNOSIS — M25511 Pain in right shoulder: Secondary | ICD-10-CM

## 2018-07-25 NOTE — Telephone Encounter (Signed)
Shoulder MRI arthrogram ordered as per discussion and xray findings. Pain x 6 weeks.  Failed PT. Xray shows distal clavicle osteolysis

## 2018-07-30 ENCOUNTER — Ambulatory Visit: Payer: No Typology Code available for payment source | Admitting: Rehabilitative and Restorative Service Providers"

## 2018-07-30 ENCOUNTER — Encounter: Payer: Self-pay | Admitting: Rehabilitative and Restorative Service Providers"

## 2018-07-30 DIAGNOSIS — M25562 Pain in left knee: Secondary | ICD-10-CM | POA: Diagnosis not present

## 2018-07-30 DIAGNOSIS — R293 Abnormal posture: Secondary | ICD-10-CM

## 2018-07-30 DIAGNOSIS — M25511 Pain in right shoulder: Secondary | ICD-10-CM

## 2018-07-30 DIAGNOSIS — R29898 Other symptoms and signs involving the musculoskeletal system: Secondary | ICD-10-CM

## 2018-07-30 NOTE — Therapy (Addendum)
[place], [place], [address] Phone: [phone]   Fax:  [phone]  Physical Therapy Treatment  Patient Details  Name: Logan Mcguire MRN: [id number] Date of Birth: 06/09/1991 Referring Provider (PT): Dr Lynne Leader    Encounter Date: 07/30/2018  PT End of Session - 07/30/18 0714    Visit Number  6    Number of Visits  12    Date for PT Re-Evaluation  08/06/18    PT Start Time  0713    PT Stop Time  0801    PT Time Calculation (min)  48 min    Activity Tolerance  Patient tolerated treatment well       Past Medical History:  Diagnosis Date  . History of high blood pressure   . History of high cholesterol     History reviewed. No pertinent surgical history.  There were no vitals filed for this visit.  Subjective Assessment - 07/30/18 0716    Subjective  Patient reports that his shoulder is about the same. He slept on his stomach last night and awoke with significant pain in the Rt shoulder this am. Got the xray last week which showed fraying of the distal clavical.     Currently in Pain?  Yes    Pain Score  5     Pain Location  Shoulder    Pain Orientation  Right;Anterior    Pain Descriptors / Indicators  Aching;Tightness         [facility] PT Assessment - 07/30/18 0001      Assessment   Medical Diagnosis  Rt shoulder dysfunction     Referring Provider (PT)  Dr Lynne Leader     Onset Date/Surgical Date  03/03/18    Hand Dominance  Right    Next MD Visit  PRN     Prior Therapy  yes for knee       Posture/Postural Control   Posture Comments  shoulders rounded forward slightly; head of the humerus anterior in orientation; scapulae abducted and rotated along the thoracic wall       AROM   Right Shoulder Flexion  160 Degrees    Right Shoulder ABduction  165 Degrees    Left Shoulder Flexion  157 Degrees    Left Shoulder ABduction  158 Degrees      Strength   Overall Strength Comments   weaker middle and lower trap 5-/5 compared to anterior chest and upper traps 5/5       Palpation   Palpation comment  muscular tightness Rt > Lt pecs; upper trap; leveator; teres - anterior Rt shoulder into biceps                    [facility] Adult PT Treatment/Exercise - 07/30/18 0001      Shoulder Exercises: Stretch   Wall Stretch - ABduction  2 reps;30 seconds    Other Shoulder Stretches  bicep stretch with hands behind back (strap assist) x 15 x 4 reps; horiz abdct stretch (hand on door) x 30 sec x 2 reps    Other Shoulder Stretches  3 way doorway stretch 30 sec x 2 each position; arms overhead x 30 sec x 2       Ultrasound   Ultrasound Location  anterior shoulder - biceps tendon area;    Ultrasound Parameters  100%; 1.5 w/cm2; 1 mHz; 10 min     Ultrasound Goals  Pain   tightness  Iontophoresis   Type of Iontophoresis  Dexamethasone    Location  anterior Rt shoulder    Dose  120 mAmp    Time  12 min       Manual Therapy   Manual Therapy  Soft tissue mobilization;Taping    Manual therapy comments  pt supine     Soft tissue mobilization  deep tissue work through the pecs; biceps; brachialis; anterior deltoid     Myofascial Release  anterior chest     Passive ROM  Rt shoulder extension with elbow extension; horozontal abduction with elbow extension; pec stretch with trunk rotation              PT Education - 07/30/18 0809    Education Details  IONTO; continue stretching; postural strengthening     Person(s) Educated  Patient    Methods  Explanation;Demonstration;Tactile cues;Verbal cues;Handout    Comprehension  Verbalized understanding;Returned demonstration;Verbal cues required          PT Long Term Goals - 06/25/18 0838      PT LONG TERM GOAL #1   Title  Improve posture and alignment with patient to demonstrate improved upright posture with posterior shoulder girdle engaged 08/06/18    Time  6    Period  Weeks    Status  New      PT LONG TERM  GOAL #2   Title  Decrease Rt shoulder pain with functional activities by 75-80% including pushing and lifting activities 08/06/18    Time  6    Period  Weeks    Status  New      PT LONG TERM GOAL #3   Title  Increase posterior shoulder girdle strength with patient demonstrating improved muscular balance with functional activities 08/06/18    Time  6    Period  Weeks    Status  New      PT LONG TERM GOAL #4   Title  Independent in HEP 08/06/18    Time  6    Period  Weeks    Status  New      PT LONG TERM GOAL #5   Title  Improve FOTO to </= 22% limitation 08/06/18    Time  6    Period  Weeks    Status  New            Plan - 07/30/18 0810    Clinical Impression Statement  Continued intermittent pain - better with stretching. He has made progress with PT but continues to be limited with lifting activities and is unable to return to his exercise program. Xray showed mild fraying of distal clavicle. Patient is scheduled for MRI arthrogram 08/13/18. Will hold PT until after MRI.      Rehab Potential  Good    PT Frequency  2x / week    PT Duration  6 weeks    PT Treatment/Interventions  Patient/family education;ADLs/Self Care Home Management;Cryotherapy;Electrical Stimulation;Iontophoresis 54m/ml Dexamethasone;Moist Heat;Ultrasound;Dry needling;Manual techniques;Neuromuscular re-education;Therapeutic activities;Therapeutic exercise    PT Next Visit Plan  continue with stretching; manual work; posterior shoulder girdle strengthening; modalities as indicated  - Hold until after MRI 08/13/18    Consulted and Agree with Plan of Care  Patient       Patient will benefit from skilled therapeutic intervention in order to improve the following deficits and impairments:  Postural dysfunction, Improper body mechanics, Pain, Increased fascial restricitons, Increased muscle spasms, Decreased mobility, Decreased range of motion, Decreased strength, Decreased activity tolerance  Visit  Diagnosis: Acute pain of right shoulder  Other symptoms and signs involving the musculoskeletal system  Abnormal posture  Acute pain of left knee     Problem List Patient Active Problem List   Diagnosis Date Noted  . Quadriceps tendonitis 12/04/2017  . Medial epicondylitis 12/04/2017  . Left knee pain 09/13/2017  . Left lower quadrant abdominal pain of unknown etiology 09/13/2017    Eniola Cerullo Nilda Simmer PT, MPH  07/30/2018, 8:14 AM  [facility] [place], [place], [address] Phone: [phone]   Fax:  [phone]  Name: Logan Mcguire MRN: [id number] Date of Birth: Feb 03, 1991  PHYSICAL THERAPY DISCHARGE SUMMARY  Visits from Start of Care: 6  Current functional level related to goals / functional outcomes: See last progress note for discharge status    Remaining deficits: MRI shows labral disruption    Education / Equipment: HEP Plan: Patient agrees to discharge.  Patient goals were partially met. Patient is being discharged due to                                                     ?????    Further diagnostic testing and referral to surgeon   Mikalyn Hermida P. Helene Kelp PT, MPH 08/22/18 2:40 PM

## 2018-07-30 NOTE — Patient Instructions (Signed)

## 2018-08-13 ENCOUNTER — Encounter: Payer: Self-pay | Admitting: Family Medicine

## 2018-08-13 ENCOUNTER — Ambulatory Visit (INDEPENDENT_AMBULATORY_CARE_PROVIDER_SITE_OTHER): Payer: No Typology Code available for payment source | Admitting: Family Medicine

## 2018-08-13 ENCOUNTER — Ambulatory Visit (INDEPENDENT_AMBULATORY_CARE_PROVIDER_SITE_OTHER): Payer: No Typology Code available for payment source

## 2018-08-13 VITALS — BP 134/75 | HR 79 | Ht 76.0 in | Wt 208.0 lb

## 2018-08-13 DIAGNOSIS — S43431A Superior glenoid labrum lesion of right shoulder, initial encounter: Secondary | ICD-10-CM

## 2018-08-13 DIAGNOSIS — G8929 Other chronic pain: Secondary | ICD-10-CM

## 2018-08-13 DIAGNOSIS — M25511 Pain in right shoulder: Secondary | ICD-10-CM

## 2018-08-13 NOTE — Progress Notes (Signed)
    Patient presents to clinic for  previously scheduled gadolinium interarticular contrast.  Procedure: Real-time Ultrasound Guided Injection of right glenohumeral joint Device: GE Logiq E  Images permanently stored and available for review in the ultrasound unit. Verbal informed consent obtained. Discussed risks and benefits of procedure. Warned about infection bleeding damage to structures skin hypopigmentation and fat atrophy among others. Patient expresses understanding and agreement Time-out conducted.  Noted no overlying erythema, induration, or other signs of local infection.  Skin prepped in a sterile fashion.  Local anesthesia: Topical Ethyl chloride.  With sterile technique and under real time ultrasound guidance:40 mg of Kenalog 4 mL of Marcaine, 0.1 mL gadolinium contrast, and 5 mL of sterile saline injected easily.  Completed without difficulty    Advised to call if fevers/chills, erythema, induration, drainage, or persistent bleeding.  Images permanently stored and available for review in the ultrasound unit.  Impression: Technically successful ultrasound guided injection.

## 2018-08-13 NOTE — Patient Instructions (Addendum)
Thank you for coming in today. Call or go to the ER if you develop a large red swollen joint with extreme pain or oozing puss.  Go to MRI now.  I will get results to you today or tomorrow.   Dr Rennis ChrisSupple

## 2018-08-14 ENCOUNTER — Telehealth: Payer: Self-pay | Admitting: Family Medicine

## 2018-08-14 DIAGNOSIS — M25511 Pain in right shoulder: Secondary | ICD-10-CM

## 2018-08-14 DIAGNOSIS — S43431A Superior glenoid labrum lesion of right shoulder, initial encounter: Secondary | ICD-10-CM

## 2018-08-14 NOTE — Telephone Encounter (Signed)
Orthopedic surgery referral.  See result note from MRI 08/13/18

## 2018-08-29 ENCOUNTER — Telehealth: Payer: Self-pay | Admitting: Family Medicine

## 2018-08-29 NOTE — Telephone Encounter (Signed)
Received notes from Dr. supple regarding referral for shoulder.  Patient doing pretty well and plan for trial of conservative management prior to proceeding with surgery.

## 2019-03-02 IMAGING — MR MR SHOULDER*R* W/CM
6 series · 40 of 40 positions shown · IV contrast (agent unspecified)
Comparison: Radiographs dated 07/24/2018

CLINICAL DATA: Right shoulder pain for 6 months.

EXAM:
MR ARTHROGRAM OF THE RIGHT SHOULDER
TECHNIQUE: Multiplanar, multisequence MR imaging of the right shoulder was
performed following the administration of intra-articular contrast.
CONTRAST:  See Injection Documentation.

[Series 4: T1 fat-sat · oblique · 4.0mm · 0.59mm/px · 6 of 22 slices shown (1 of 4)]
[im 1/22]
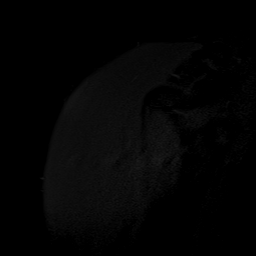
[im 5/22]
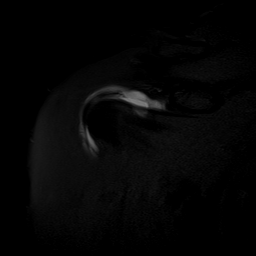
[im 9/22]
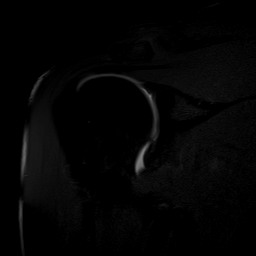
[im 13/22]
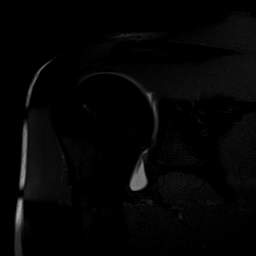
[im 17/22]
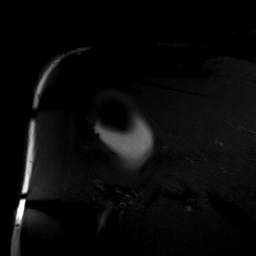
[im 22/22]
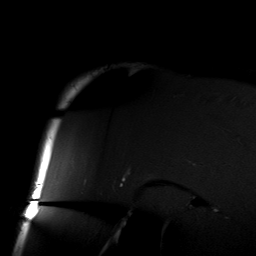

[Series 5: T2 fat-sat · oblique · 4.0mm · 0.59mm/px · 6 of 22 slices shown (1 of 2)]
[im 1/22]
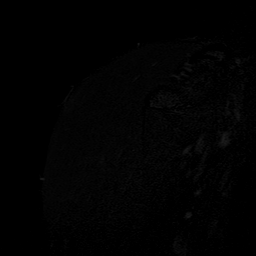
[im 5/22]
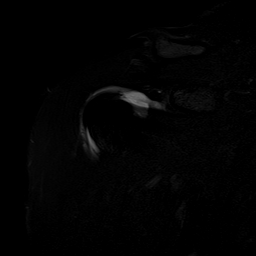
[im 9/22]
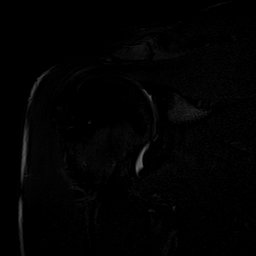
[im 13/22]
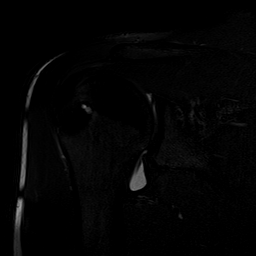
[im 17/22]
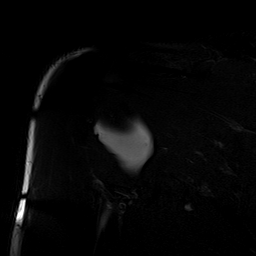
[im 22/22]
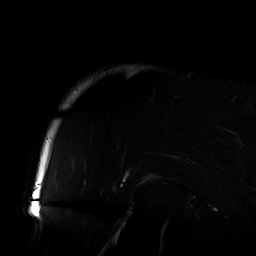

[Series 6: T1 fat-sat · oblique · non-contrast · 4.0mm · 0.47mm/px · 7 of 22 slices shown (2 of 4)]
[im 1/22]
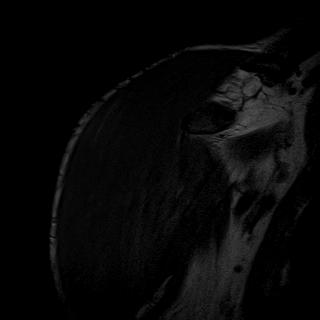
[im 4/22]
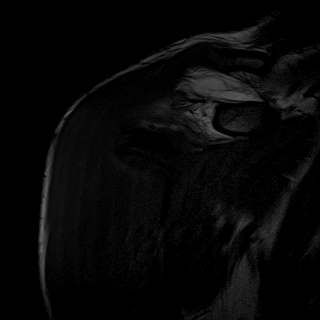
[im 8/22]
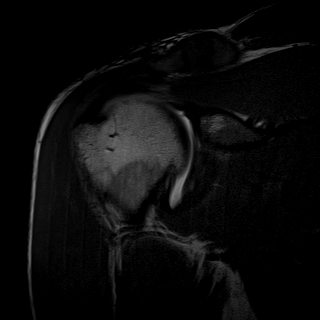
[im 11/22]
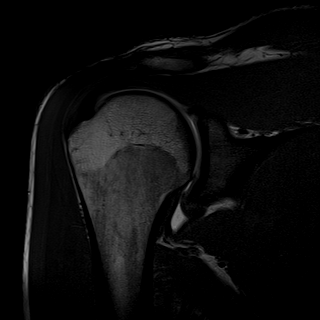
[im 15/22]
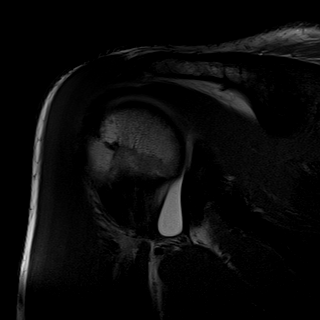
[im 18/22]
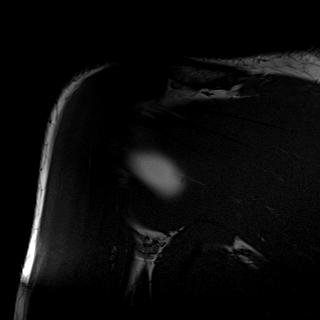
[im 22/22]
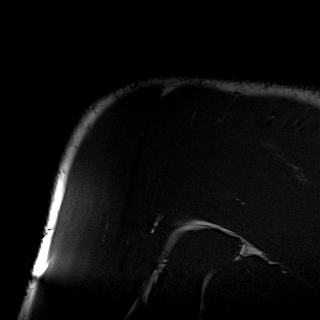

[Series 7: T2 fat-sat · sagittal · 4.0mm · 0.59mm/px · 8 of 26 slices shown (2 of 2)]
[im 1/26]
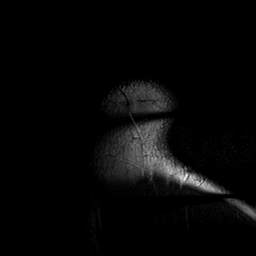
[im 4/26]
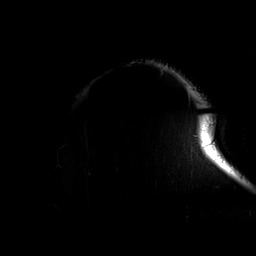
[im 8/26]
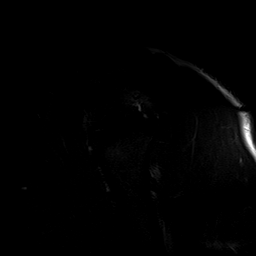
[im 11/26]
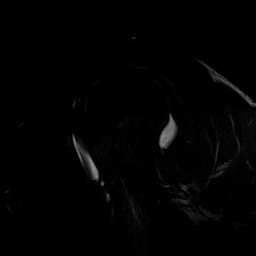
[im 15/26]
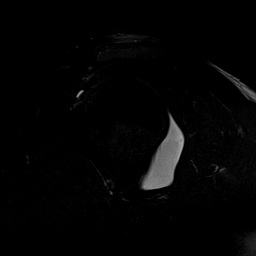
[im 18/26]
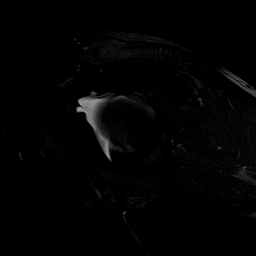
[im 22/26]
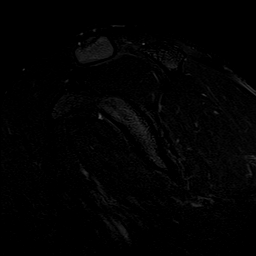
[im 26/26]
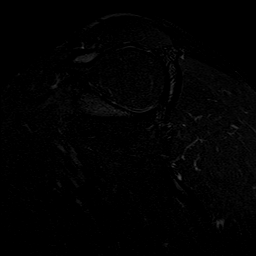

[Series 8: T1 fat-sat · axial · 4.0mm · 0.51mm/px · z∈[-36,+74]mm · 8 of 26 slices shown (3 of 4)]
[im 1/26]
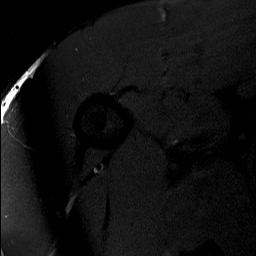
[im 4/26]
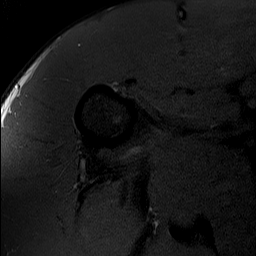
[im 8/26]
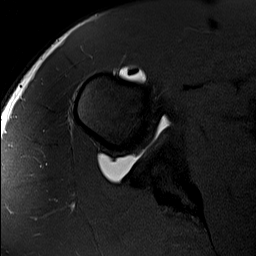
[im 11/26]
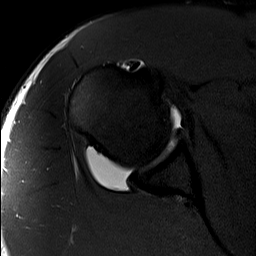
[im 15/26]
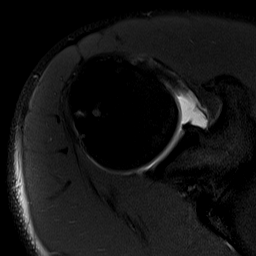
[im 18/26]
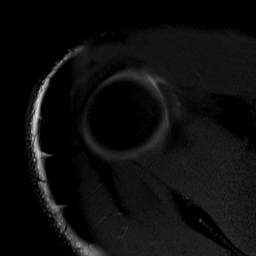
[im 22/26]
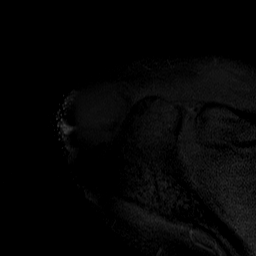
[im 26/26]
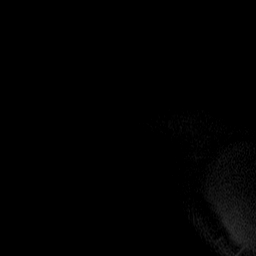

[Series 11: T1 fat-sat · sagittal · 4.0mm · 0.59mm/px · 5 of 17 slices shown (4 of 4)]
[im 1/17]
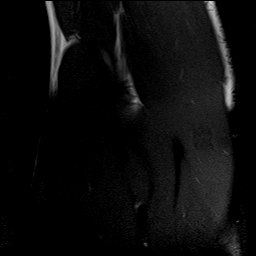
[im 5/17]
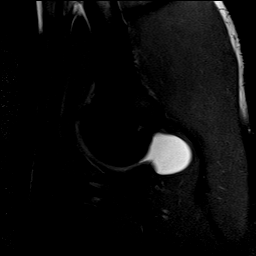
[im 9/17]
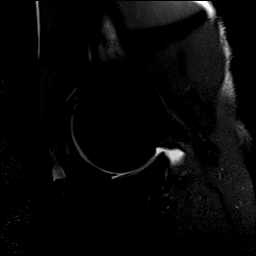
[im 13/17]
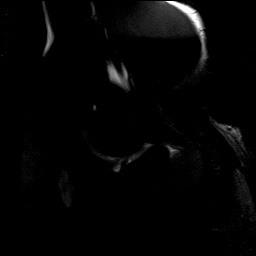
[im 17/17]
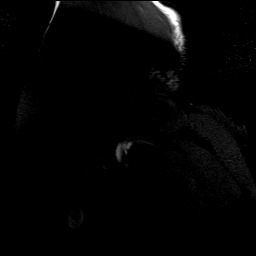

[40 of 40 positions shown; findings below may reference images not displayed]

FINDINGS: Rotator cuff: Normal.

Muscles: Normal.

Biceps long head: Properly located and intact.

Acromioclavicular Joint: Slight irregularity of the distal clavicle.
No joint effusion or appreciable inflammation. Type 2 acromion. No
bursitis.

Glenohumeral Joint: There is a focal small full-thickness cartilage
defect at the anterior inferior aspect of the glenoid with
undermining of the cartilage best seen on image 11 of series 11.

Labrum: There is a GLAD lesion of the anterior inferior aspect of
the labrum. Cartilage defect is immediately adjacent to the labral
tear, best seen on images 8 through 12 of series 11.

Bones: Normal.
IMPRESSION: GLAD (glenolabral articular disruption) at the anterior inferior
aspect of the glenoid labrum.

Slight erosive changes at the distal clavicle, nonspecific. No other
appreciable inflammatory changes at the AC joint

## 2019-06-05 ENCOUNTER — Ambulatory Visit (INDEPENDENT_AMBULATORY_CARE_PROVIDER_SITE_OTHER): Payer: Managed Care, Other (non HMO) | Admitting: Osteopathic Medicine

## 2019-06-05 ENCOUNTER — Other Ambulatory Visit: Payer: Self-pay

## 2019-06-05 ENCOUNTER — Encounter: Payer: Self-pay | Admitting: Osteopathic Medicine

## 2019-06-05 VITALS — BP 134/80 | HR 82 | Temp 97.6°F | Wt 216.0 lb

## 2019-06-05 DIAGNOSIS — R030 Elevated blood-pressure reading, without diagnosis of hypertension: Secondary | ICD-10-CM

## 2019-06-05 DIAGNOSIS — Z0000 Encounter for general adult medical examination without abnormal findings: Secondary | ICD-10-CM

## 2019-06-05 MED ORDER — KETOCONAZOLE 2 % EX SHAM: 1.0000 "application " | MEDICATED_SHAMPOO | CUTANEOUS | 2 refills | Status: DC

## 2019-06-05 MED ORDER — CLOBETASOL PROPIONATE 0.05 % EX FOAM: Freq: Two times a day (BID) | CUTANEOUS | 2 refills | Status: DC

## 2019-06-05 NOTE — Progress Notes (Signed)
HPI: Logan Mcguire is a 28 y.o. male who  has a past medical history of History of high blood pressure and History of high cholesterol.  he presents to [facility] today, 06/05/19,  for chief complaint of: Annual  Skin problem   Patient here for annual physical / wellness exam.  See preventive care reviewed as below.   Additional concerns today include:   Scalp: dandruffy but pretty bad to the point of scabs and large flakes, scabbing red spots. He has tried nizoral, T-gel  BP elevated up to 170/75 on intake, better on recheck but still high at 142/96, third check was ok BP Readings from Last 3 Encounters:  06/05/19 134/80  08/13/18 134/75  06/18/18 138/68   .   Past medical, surgical, social and family history reviewed:  Patient Active Problem List   Diagnosis Date Noted  . Quadriceps tendonitis 12/04/2017  . Medial epicondylitis 12/04/2017  . Left knee pain 09/13/2017  . Left lower quadrant abdominal pain of unknown etiology 09/13/2017    No past surgical history on file.  Social History   Tobacco Use  . Smoking status: Never Smoker  . Smokeless tobacco: Never Used  Substance Use Topics  . Alcohol use: Yes    Alcohol/week: 0.0 standard drinks    Comment: Socially    Family History  Problem Relation Age of Onset  . Heart attack Maternal Grandmother   . Diabetes Maternal Grandmother   . High blood pressure Maternal Grandmother   . High Cholesterol Maternal Grandmother   . Heart attack Maternal Grandfather   . Diabetes Maternal Grandfather   . High blood pressure Maternal Grandfather   . High Cholesterol Maternal Grandfather   . Lung cancer Paternal Grandfather   . Prostate cancer Paternal Grandfather      Current medication list and allergy/intolerance information reviewed:    Current Outpatient Medications  Medication Sig Dispense Refill  . diclofenac sodium (VOLTAREN) 1 % GEL   11   No current facility-administered  medications for this visit.     No Known Allergies    Review of Systems:  Constitutional:  No  fever, no chills, No recent illness, No unintentional weight changes. No significant fatigue.   HEENT: No  headache, no vision change, no hearing change, No sore throat, No  sinus pressure  Cardiac: No  chest pain, No  pressure, No palpitations, No  Orthopnea  Respiratory:  No  shortness of breath. No  Cough  Gastrointestinal: No  abdominal pain, No  nausea, No  vomiting,  No  blood in stool, No  diarrhea, No  constipation   Musculoskeletal: No new myalgia/arthralgia  Skin: No  Rash not - see HPI re: scalp, No other wounds/concerning lesions  Genitourinary: No  incontinence, No  abnormal genital bleeding, No abnormal genital discharge  Hem/Onc: No  easy bruising/bleeding, No  abnormal lymph node  Endocrine: No cold intolerance,  No heat intolerance. No polyuria/polydipsia/polyphagia   Neurologic: No  weakness, No  dizziness, No  slurred speech/focal weakness/facial droop  Psychiatric: No  concerns with depression, No  concerns with anxiety, No sleep problems, No mood problems  Exam:  BP 134/80 (BP Location: Left Arm, Patient Position: Sitting, Cuff Size: Normal)   Pulse 82   Temp 97.6 F (36.4 C) (Oral)   Wt 216 lb (98 kg)   BMI 26.29 kg/m   Constitutional: VS see above. General Appearance: alert, well-developed, well-nourished, NAD  Eyes: Normal lids and conjunctive, non-icteric sclera  Ears, Nose, Mouth, Throat: TM normal bilaterally.   Neck: No masses, trachea midline. No thyroid enlargement. No tenderness/mass appreciated. No lymphadenopathy  Respiratory: Normal respiratory effort. no wheeze, no rhonchi, no rales  Cardiovascular: S1/S2 normal, no murmur, no rub/gallop auscultated. RRR. No lower extremity edema. Pedal pulse II/IV bilaterally DP and PT. No carotid bruit or JVD. No abdominal aortic bruit.  Gastrointestinal: Nontender, no masses. No hepatomegaly, no  splenomegaly. No hernia appreciated. Bowel sounds normal. Rectal exam deferred.   Musculoskeletal: Gait normal. No clubbing/cyanosis of digits.   Neurological: Normal balance/coordination. No tremor. No cranial nerve deficit on limited exam. Motor and sensation intact and symmetric. Cerebellar reflexes intact.   Skin: warm, dry, intact. No rash/ulcer. No concerning nevi or subq nodules on limited exam.    Psychiatric: Normal judgment/insight. Normal mood and affect. Oriented x3.    No results found for this or any previous visit (from the past 72 hour(s)).  No results found.      ASSESSMENT/PLAN: The primary encounter diagnosis was Annual physical exam. A diagnosis of White coat syndrome without diagnosis of hypertension was also pertinent to this visit.   Orders Placed This Encounter  Procedures  . CBC  . COMPLETE METABOLIC PANEL WITH GFR  . Lipid panel    Meds ordered this encounter  Medications  . ketoconazole (NIZORAL) 2 % shampoo    Sig: Apply 1 application topically 2 (two) times a week.    Dispense:  120 mL    Refill:  2  . clobetasol (OLUX) 0.05 % topical foam    Sig: Apply topically 2 (two) times daily. To scalp as needed    Dispense:  50 g    Refill:  2    Patient Instructions  Scalp: 1. Maintenance: Neutrogena T-Sal (OTC) 2. Mild flare: Ketoconazole shampoo (prescription) 3. More severe symptoms: Clobetasol foam (prescription)   General Preventive Care  Most recent routine screening lipids/other labs: ordered today   BP: elevated today! Goal 130/80 or less.   Tobacco: don't!   Alcohol: responsible moderation is ok for most adults - if you have concerns about your alcohol intake, please talk to me!   Exercise: as tolerated to reduce risk of cardiovascular disease and diabetes. Strength training will also prevent osteoporosis.   Mental health: if need for mental health care (medicines, counseling, other), or concerns about moods, please let me know!    Sexual health: if need for STD testing, or if concerns with libido/pain problems, please let me know!   Advanced Directive: Living Will and/or Healthcare Power of Attorney recommended for all adults, regardless of age or health.  Vaccines  Flu vaccine: recommended for almost everyone, every fall.   Shingles vaccine: Shingrix recommended after age 28.  Pneumonia vaccines: Prevnar and Pneumovax recommended after age 28, or sooner if certain medical conditions/smoker   Tetanus booster: Tdap recommended every 10 years.  Cancer screenings   Colon cancer screening: recommended for everyone at age 28, but some folks need a colonoscopy sooner if risk factors   Prostate cancer screening: PSA blood test around age 255  Lung cancer screening: not needed for non-smokers  Infection screenings . HIV: recommended screening at least once age 418-65, more often as needed. . Gonorrhea/Chlamydia: screening as needed. . Hepatitis C: recommended for anyone born [id number] . TB: certain at-risk populations, or depending on work requirements and/or travel history Other . Bone Density Test: recommended for men at age 28, sooner depending on risk factors . Abdominal Aortic Aneurysm: screening with  ultrasound recommended once for men age 49-75 who have ever smoked 100+ cigarettes in their life       Visit summary with medication list and pertinent instructions was printed for patient to review. All questions at time of visit were answered - patient instructed to contact office with any additional concerns or updates. ER/RTC precautions were reviewed with the patient.    Please note: voice recognition software was used to produce this document, and typos may escape review. Please contact Dr. Lyn Hollingshead for any needed clarifications.     Follow-up plan: Return in about 1 year (around 06/04/2020) for [place] (call week prior to visit for lab orders).

## 2019-06-05 NOTE — Patient Instructions (Addendum)
Scalp: 1. Maintenance: Neutrogena T-Sal (OTC) 2. Mild flare: Ketoconazole shampoo (prescription) 3. More severe symptoms: Clobetasol foam (prescription)   General Preventive Care  Most recent routine screening lipids/other labs: ordered today   BP: elevated today! Goal 130/80 or less.   Tobacco: don't!   Alcohol: responsible moderation is ok for most adults - if you have concerns about your alcohol intake, please talk to me!   Exercise: as tolerated to reduce risk of cardiovascular disease and diabetes. Strength training will also prevent osteoporosis.   Mental health: if need for mental health care (medicines, counseling, other), or concerns about moods, please let me know!   Sexual health: if need for STD testing, or if concerns with libido/pain problems, please let me know!   Advanced Directive: Living Will and/or Healthcare Power of Attorney recommended for all adults, regardless of age or health.  Vaccines  Flu vaccine: recommended for almost everyone, every fall.   Shingles vaccine: Shingrix recommended after age 43.  Pneumonia vaccines: Prevnar and Pneumovax recommended after age 67, or sooner if certain medical conditions/smoker   Tetanus booster: Tdap recommended every 10 years.  Cancer screenings   Colon cancer screening: recommended for everyone at age 65, but some folks need a colonoscopy sooner if risk factors   Prostate cancer screening: PSA blood test around age 36  Lung cancer screening: not needed for non-smokers  Infection screenings . HIV: recommended screening at least once age 27-65, more often as needed. . Gonorrhea/Chlamydia: screening as needed. . Hepatitis C: recommended for anyone born 68-1965 . TB: certain at-risk populations, or depending on work requirements and/or travel history Other . Bone Density Test: recommended for men at age 33, sooner depending on risk factors . Abdominal Aortic Aneurysm: screening with ultrasound recommended once  for men age 53-75 who have ever smoked 100+ cigarettes in their life

## 2019-06-06 ENCOUNTER — Encounter: Payer: Self-pay | Admitting: Osteopathic Medicine

## 2019-06-06 LAB — COMPLETE METABOLIC PANEL WITH GFR
AG Ratio: 2 (calc) (ref 1.0–2.5)
ALT: 22 U/L (ref 9–46)
AST: 35 U/L (ref 10–40)
Albumin: 4.5 g/dL (ref 3.6–5.1)
Alkaline phosphatase (APISO): 60 U/L (ref 36–130)
BUN: 18 mg/dL (ref 7–25)
CO2: 30 mmol/L (ref 20–32)
Calcium: 9.6 mg/dL (ref 8.6–10.3)
Chloride: 102 mmol/L (ref 98–110)
Creat: 1.12 mg/dL (ref 0.60–1.35)
GFR, Est African American: 103 mL/min/{1.73_m2} (ref >=60)
GFR, Est Non African American: 89 mL/min/{1.73_m2} (ref >=60)
Globulin: 2.3 g/dL (calc) (ref 1.9–3.7)
Glucose, Bld: 84 mg/dL (ref 65–99)
Potassium: 4.4 mmol/L (ref 3.5–5.3)
Sodium: 139 mmol/L (ref 135–146)
Total Bilirubin: 0.7 mg/dL (ref 0.2–1.2)
Total Protein: 6.8 g/dL (ref 6.1–8.1)

## 2019-06-06 LAB — LIPID PANEL
Cholesterol: 241 mg/dL — ABNORMAL HIGH (ref <200)
HDL: 71 mg/dL (ref >=40)
LDL Cholesterol (Calc): 145 mg/dL (calc) — ABNORMAL HIGH
Non-HDL Cholesterol (Calc): 170 mg/dL (calc) — ABNORMAL HIGH (ref <130)
Total CHOL/HDL Ratio: 3.4 (calc) (ref <5.0)
Triglycerides: 124 mg/dL (ref <150)

## 2019-06-06 LAB — CBC
HCT: 46 % (ref 38.5–50.0)
Hemoglobin: 15.3 g/dL (ref 13.2–17.1)
MCH: 29.4 pg (ref 27.0–33.0)
MCHC: 33.3 g/dL (ref 32.0–36.0)
MCV: 88.3 fL (ref 80.0–100.0)
MPV: 10.8 fL (ref 7.5–12.5)
Platelets: 256 10*3/uL (ref 140–400)
RBC: 5.21 10*6/uL (ref 4.20–5.80)
RDW: 12.5 % (ref 11.0–15.0)
WBC: 5.7 10*3/uL (ref 3.8–10.8)

## 2019-12-27 ENCOUNTER — Telehealth: Payer: Managed Care, Other (non HMO) | Admitting: Nurse Practitioner

## 2019-12-27 DIAGNOSIS — B9789 Other viral agents as the cause of diseases classified elsewhere: Secondary | ICD-10-CM | POA: Diagnosis not present

## 2019-12-27 DIAGNOSIS — J019 Acute sinusitis, unspecified: Secondary | ICD-10-CM | POA: Diagnosis not present

## 2019-12-27 MED ORDER — FLUTICASONE PROPIONATE 50 MCG/ACT NA SUSP: 2.0000 | Freq: Every day | NASAL | 0 refills | Status: DC

## 2019-12-27 NOTE — Progress Notes (Signed)
We are sorry that you are not feeling well.  Here is how we plan to help!  Based on what you have shared with me it looks like you have sinusitis.  Sinusitis is inflammation and infection in the sinus cavities of the head.  Based on your presentation I believe you most likely have Acute Viral Sinusitis.This is an infection most likely caused by a virus. There is not specific treatment for viral sinusitis other than to help you with the symptoms until the infection runs its course.  You may use an oral decongestant such as Mucinex D or if you have glaucoma or high blood pressure use plain Mucinex. You may also over-the-counter Zyrtec. Saline nasal spray help and can safely be used as often as needed for congestion, I have prescribed: Fluticasone nasal spray two sprays in each nostril once a day. Based on the duration of your symptoms, antibiotics are not recommended at this time.    Some authorities believe that zinc sprays or the use of Echinacea may shorten the course of your symptoms.  Sinus infections are not as easily transmitted as other respiratory infection, however we still recommend that you avoid close contact with loved ones, especially the very young and elderly.  Remember to wash your hands thoroughly throughout the day as this is the number one way to prevent the spread of infection!  Home Care:  Only take medications as instructed by your medical team.  Do not take these medications with alcohol.  A steam or ultrasonic humidifier can help congestion.  You can place a towel over your head and breathe in the steam from hot water coming from a faucet.  Avoid close contacts especially the very young and the elderly.  Cover your mouth when you cough or sneeze.  Always remember to wash your hands.  Get Help Right Away If:  You develop worsening fever or sinus pain.  You develop a severe head ache or visual changes.  Your symptoms persist after you have completed your treatment  plan.  Make sure you  Understand these instructions.  Will watch your condition.  Will get help right away if you are not doing well or get worse.  Your e-visit answers were reviewed by a board certified advanced clinical practitioner to complete your personal care plan.  Depending on the condition, your plan could have included both over the counter or prescription medications.  If there is a problem please reply  once you have received a response from your provider.  Your safety is important to [place].  If you have drug allergies check your prescription carefully.    You can use MyChart to ask questions about today's visit, request a non-urgent call back, or ask for a work or school excuse for 24 hours related to this e-Visit. If it has been greater than 24 hours you will need to follow up with your provider, or enter a new e-Visit to address those concerns.  You will get an e-mail in the next two days asking about your experience.  I hope that your e-visit has been valuable and will speed your recovery. Thank you for using e-visits.  I have spent at least 5 minutes reviewing and documenting in the patient's chart.

## 2020-04-20 ENCOUNTER — Encounter: Payer: Self-pay | Admitting: Osteopathic Medicine

## 2020-04-23 ENCOUNTER — Telehealth: Payer: Self-pay

## 2020-04-23 NOTE — Telephone Encounter (Signed)
Patient has left several messages on voicemail about migraine headaches. I called the patient this morning he did not answer. I left voicemail for him to call office and schedule an appointment to further discuss with Dr. Lyn Hollingshead. Can you please try and call the patient to get him scheduled. It can be Mychart or in person. Thanks you

## 2020-04-23 NOTE — Telephone Encounter (Signed)
Thank You.

## 2020-04-23 NOTE — Telephone Encounter (Signed)
PT scheduled

## 2020-04-27 ENCOUNTER — Other Ambulatory Visit: Payer: Self-pay

## 2020-04-27 ENCOUNTER — Ambulatory Visit (INDEPENDENT_AMBULATORY_CARE_PROVIDER_SITE_OTHER): Payer: Managed Care, Other (non HMO)

## 2020-04-27 ENCOUNTER — Encounter: Payer: Self-pay | Admitting: Nurse Practitioner

## 2020-04-27 ENCOUNTER — Ambulatory Visit (INDEPENDENT_AMBULATORY_CARE_PROVIDER_SITE_OTHER): Payer: Managed Care, Other (non HMO) | Admitting: Sports Medicine

## 2020-04-27 DIAGNOSIS — M542 Cervicalgia: Secondary | ICD-10-CM

## 2020-04-27 MED ORDER — MELOXICAM 15 MG PO TABS: ORAL_TABLET | ORAL | 3 refills | Status: DC

## 2020-04-27 MED ORDER — PREDNISONE 50 MG PO TABS: ORAL_TABLET | ORAL | 0 refills | Status: DC

## 2020-04-27 MED ORDER — CYCLOBENZAPRINE HCL 10 MG PO TABS: ORAL_TABLET | ORAL | 0 refills | Status: DC

## 2020-04-27 NOTE — Progress Notes (Signed)
    Procedures performed today:    None.  Independent interpretation of notes and tests performed by another provider:   None.  Brief History, Exam, Impression, and Recommendations:    Neck pain This is a pleasant 29 year old male, he has a 3-week history of pain in his neck, he is a heavy weightlifter. He has been seeing chiropractor without much improvement. Pain is axial, radiates up the back of his neck from the suboccipital region all the way to his frontalis. He denies anything radicular, progressive weakness, no constitutional symptoms, no recent trauma. His pain is likely due to cervical herniated disc. He may discontinue chiropractic manipulation, switch to home physical therapy, 5 days of prednisone followed by meloxicam, cyclobenzaprine at night. X-rays today, return to see me in 4 to 6 weeks, MRI for interventional planning if no better. In the meantime he will cut his weight back to 50 to 75%.    ___________________________________________ Ihor Austin. Benjamin Stain, M.D., ABFM., CAQSM. Primary Care and Sports Medicine [facility]  Adjunct Instructor of Family Medicine  [employer]

## 2020-04-27 NOTE — Assessment & Plan Note (Signed)
This is a pleasant 29 year old male, he has a 3-week history of pain in his neck, he is a heavy weightlifter. He has been seeing chiropractor without much improvement. Pain is axial, radiates up the back of his neck from the suboccipital region all the way to his frontalis. He denies anything radicular, progressive weakness, no constitutional symptoms, no recent trauma. His pain is likely due to cervical herniated disc. He may discontinue chiropractic manipulation, switch to home physical therapy, 5 days of prednisone followed by meloxicam, cyclobenzaprine at night. X-rays today, return to see me in 4 to 6 weeks, MRI for interventional planning if no better. In the meantime he will cut his weight back to 50 to 75%.

## 2020-05-07 DIAGNOSIS — M542 Cervicalgia: Secondary | ICD-10-CM

## 2020-05-07 DIAGNOSIS — G8929 Other chronic pain: Secondary | ICD-10-CM

## 2020-05-11 DIAGNOSIS — G8929 Other chronic pain: Secondary | ICD-10-CM | POA: Insufficient documentation

## 2020-05-11 DIAGNOSIS — R519 Headache, unspecified: Secondary | ICD-10-CM | POA: Insufficient documentation

## 2020-05-12 NOTE — Telephone Encounter (Signed)
Please find out what his question is before it comes to me

## 2020-06-01 ENCOUNTER — Telehealth: Payer: Self-pay | Admitting: Osteopathic Medicine

## 2020-06-01 DIAGNOSIS — Z0000 Encounter for general adult medical examination without abnormal findings: Secondary | ICD-10-CM

## 2020-06-01 NOTE — Telephone Encounter (Signed)
Standard blood work that comes with physical and the lipid panel as well. He wanted to make sure that was added.

## 2020-06-02 NOTE — Telephone Encounter (Signed)
Just for clarification- is this patient requesting lab ordered be placed prior to his physical?

## 2020-06-02 NOTE — Telephone Encounter (Signed)
Orders are in

## 2020-06-02 NOTE — Telephone Encounter (Signed)
Patient has been made aware. No further questions at this time.  

## 2020-06-02 NOTE — Telephone Encounter (Signed)
Yes

## 2020-06-04 LAB — COMPLETE METABOLIC PANEL WITH GFR
AG Ratio: 2.4 (calc) (ref 1.0–2.5)
ALT: 13 U/L (ref 9–46)
AST: 18 U/L (ref 10–40)
Albumin: 4.6 g/dL (ref 3.6–5.1)
Alkaline phosphatase (APISO): 55 U/L (ref 36–130)
BUN: 19 mg/dL (ref 7–25)
CO2: 30 mmol/L (ref 20–32)
Calcium: 9.6 mg/dL (ref 8.6–10.3)
Chloride: 101 mmol/L (ref 98–110)
Creat: 1.17 mg/dL (ref 0.60–1.35)
GFR, Est African American: 97 mL/min/{1.73_m2} (ref >=60)
GFR, Est Non African American: 84 mL/min/{1.73_m2} (ref >=60)
Globulin: 1.9 g/dL (calc) (ref 1.9–3.7)
Glucose, Bld: 81 mg/dL (ref 65–99)
Potassium: 4 mmol/L (ref 3.5–5.3)
Sodium: 138 mmol/L (ref 135–146)
Total Bilirubin: 1.1 mg/dL (ref 0.2–1.2)
Total Protein: 6.5 g/dL (ref 6.1–8.1)

## 2020-06-04 LAB — LIPID PANEL
Cholesterol: 208 mg/dL — ABNORMAL HIGH (ref <200)
HDL: 56 mg/dL (ref >=40)
LDL Cholesterol (Calc): 130 mg/dL (calc) — ABNORMAL HIGH
Non-HDL Cholesterol (Calc): 152 mg/dL (calc) — ABNORMAL HIGH (ref <130)
Total CHOL/HDL Ratio: 3.7 (calc) (ref <5.0)
Triglycerides: 111 mg/dL (ref <150)

## 2020-06-04 LAB — CBC
HCT: 45.7 % (ref 38.5–50.0)
Hemoglobin: 15.7 g/dL (ref 13.2–17.1)
MCH: 30.2 pg (ref 27.0–33.0)
MCHC: 34.4 g/dL (ref 32.0–36.0)
MCV: 87.9 fL (ref 80.0–100.0)
MPV: 10.8 fL (ref 7.5–12.5)
Platelets: 233 10*3/uL (ref 140–400)
RBC: 5.2 10*6/uL (ref 4.20–5.80)
RDW: 12 % (ref 11.0–15.0)
WBC: 4.8 10*3/uL (ref 3.8–10.8)

## 2020-06-05 ENCOUNTER — Encounter: Payer: Self-pay | Admitting: Osteopathic Medicine

## 2020-06-08 ENCOUNTER — Encounter: Payer: Self-pay | Admitting: Osteopathic Medicine

## 2020-06-08 ENCOUNTER — Ambulatory Visit (INDEPENDENT_AMBULATORY_CARE_PROVIDER_SITE_OTHER): Payer: Managed Care, Other (non HMO) | Admitting: Osteopathic Medicine

## 2020-06-08 VITALS — BP 144/74 | HR 86 | Temp 98.4°F | Wt 208.0 lb

## 2020-06-08 DIAGNOSIS — R030 Elevated blood-pressure reading, without diagnosis of hypertension: Secondary | ICD-10-CM

## 2020-06-08 DIAGNOSIS — Z23 Encounter for immunization: Secondary | ICD-10-CM | POA: Diagnosis not present

## 2020-06-08 DIAGNOSIS — M79642 Pain in left hand: Secondary | ICD-10-CM | POA: Diagnosis not present

## 2020-06-08 DIAGNOSIS — Z0000 Encounter for general adult medical examination without abnormal findings: Secondary | ICD-10-CM | POA: Diagnosis not present

## 2020-06-08 DIAGNOSIS — M542 Cervicalgia: Secondary | ICD-10-CM

## 2020-06-08 NOTE — Progress Notes (Signed)
Logan Mcguire is a 29 y.o. male who presents to  [facility] at [facility]  today, 06/08/20, seeking care for the following:  . Annual physical . L hand injury, weight fell and he caught it buut hyperflexed wrist, pain w/ resisted abduction of fingers worse between 1sr/2nd and 2nd/3rd digits      ASSESSMENT & PLAN with other pertinent findings:  The primary encounter diagnosis was Annual physical exam. Diagnoses of White coat syndrome without diagnosis of hypertension, Neck pain, and Left hand pain were also pertinent to this visit.   L hand pain c/w strain of muscle, advised thumb spica splint and ibuprofen, pt declined splint, advised sports med f/u if needed.   Neck pain improved, pt would like to hold off on MRI / sports f/u   No results found for this or any previous visit (from the past 24 hour(s)).    BP Readings from Last 3 Encounters:  06/08/20 (!) 144/74  04/27/20 111/64  06/05/19 134/80    Patient Instructions  General Preventive Care  Most recent routine screening labs: see attached.   Blood pressure goal 130/80 or less.   Tobacco: don't!   Alcohol: responsible moderation is ok for most adults - if you have concerns about your alcohol intake, please talk to me!   Exercise: as tolerated to reduce risk of cardiovascular disease and diabetes. Strength training will also prevent osteoporosis.   Mental health: if need for mental health care (medicines, counseling, other), or concerns about moods, please let me know!   Sexual / Reproductive health: if need for STD testing, or if concerns with libido/pain problems, please let me know!  Advanced Directive: Living Will and/or Healthcare Power of Attorney recommended for all adults, regardless of age or health.  Vaccines  Flu vaccine: for almost everyone, every fall.   Shingles vaccine: after age 50.   Pneumonia vaccines: after age 66.  Tetanus booster: every 10 years -  due 2029  HPV vaccine: Gardasil up to age 45   COVID vaccine: THANKS for getting your vaccine! :) Cancer screenings   Colon cancer screening: for everyone age 77-75. Colonoscopy available for all, many people also qualify for the Cologuard stool test   Prostate cancer screening: PSA blood test age 102-71  Lung cancer screening: not needed for non-smokers  Infection screenings  . HIV: recommended screening at least once age 75-65, more often as needed. . Gonorrhea/Chlamydia: screening as needed . Hepatitis C: recommended once for everyone age 29-75 . TB: certain at-risk populations, or depending on work requirements and/or travel history Other . Bone Density Test: recommended for men at age 50   No orders of the defined types were placed in this encounter.   No orders of the defined types were placed in this encounter.   Immunization History  Administered Date(s) Administered  . Influenza,inj,Quad PF,6+ Mos 06/18/2018, 05/18/2019  . Tdap 01/02/2018  . Unspecified SARS-COV-2 Vaccination 11/09/2019, 11/30/2019    Constitutional:  . VSS, see nurse notes . General Appearance: alert, well-developed, well-nourished, NAD Eyes: Marland Kitchen Normal lids and conjunctive, non-icteric sclera . PERRLA Ears, Nose, Mouth, Throat: . Normal appearance . Normal external auditory canal and TM bilaterally Neck: . No masses, trachea midline . No thyroid enlargement/tenderness/mass appreciated Respiratory: . Normal respiratory effort . Breath sounds normal, no wheeze/rhonchi/rales Cardiovascular: . S1/S2 normal, no murmur/rub/gallop auscultated . No lower extremity edema Gastrointestinal: . Nontender, no masses . No hepatomegaly, no splenomegaly . No hernia appreciated Musculoskeletal:  .  Gait normal . No clubbing/cyanosis of digits Neurological: . No cranial nerve deficit on limited exam . Motor and sensation intact and symmetric Psychiatric: . Normal judgment/insight . Normal mood and  affect   Follow-up instructions: Return in about 1 year (around 06/08/2021) for [place] (call week prior to visit for lab orders).                                         BP (!) 144/74 (BP Location: Right Arm, Patient Position: Sitting, Cuff Size: Large)   Pulse 86   Temp 98.4 F (36.9 C) (Oral)   Wt 208 lb 0.6 oz (94.4 kg)   BMI 25.32 kg/m   Current Meds  Medication Sig  . ketoconazole (NIZORAL) 2 % shampoo Apply 1 application topically 2 (two) times a week.    No results found for this or any previous visit (from the past 72 hour(s)).  No results found.     All questions at time of visit were answered - patient instructed to contact office with any additional concerns or updates.  ER/RTC precautions were reviewed with the patient as applicable.   Please note: voice recognition software was used to produce this document, and typos may escape review. Please contact Dr. Lyn Hollingshead for any needed clarifications.

## 2020-06-08 NOTE — Patient Instructions (Signed)
General Preventive Care  Most recent routine screening labs: see attached.   Blood pressure goal 130/80 or less.   Tobacco: don't!   Alcohol: responsible moderation is ok for most adults - if you have concerns about your alcohol intake, please talk to me!   Exercise: as tolerated to reduce risk of cardiovascular disease and diabetes. Strength training will also prevent osteoporosis.   Mental health: if need for mental health care (medicines, counseling, other), or concerns about moods, please let me know!   Sexual / Reproductive health: if need for STD testing, or if concerns with libido/pain problems, please let me know!  Advanced Directive: Living Will and/or Healthcare Power of Attorney recommended for all adults, regardless of age or health.  Vaccines  Flu vaccine: for almost everyone, every fall.   Shingles vaccine: after age 21.   Pneumonia vaccines: after age 6.  Tetanus booster: every 10 years - due 2029  HPV vaccine: Gardasil up to age 77   COVID vaccine: THANKS for getting your vaccine! :) Cancer screenings   Colon cancer screening: for everyone age 2-75. Colonoscopy available for all, many people also qualify for the Cologuard stool test   Prostate cancer screening: PSA blood test age 72-71  Lung cancer screening: not needed for non-smokers  Infection screenings   HIV: recommended screening at least once age 58-65, more often as needed.  Gonorrhea/Chlamydia: screening as needed  Hepatitis C: recommended once for everyone age 68-75  TB: certain at-risk populations, or depending on work requirements and/or travel history Other  Bone Density Test: recommended for men at age 28

## 2020-06-08 NOTE — Addendum Note (Signed)
Addended by: Delfino Lovett on: 06/08/2020 05:42 PM   Modules accepted: Orders

## 2020-06-22 ENCOUNTER — Ambulatory Visit: Payer: Managed Care, Other (non HMO) | Admitting: Sports Medicine

## 2020-07-06 ENCOUNTER — Encounter: Payer: Self-pay | Admitting: Osteopathic Medicine

## 2020-07-27 ENCOUNTER — Ambulatory Visit (INDEPENDENT_AMBULATORY_CARE_PROVIDER_SITE_OTHER): Payer: Managed Care, Other (non HMO) | Admitting: Sports Medicine

## 2020-07-27 ENCOUNTER — Other Ambulatory Visit: Payer: Self-pay

## 2020-07-27 ENCOUNTER — Ambulatory Visit (INDEPENDENT_AMBULATORY_CARE_PROVIDER_SITE_OTHER): Payer: Managed Care, Other (non HMO)

## 2020-07-27 DIAGNOSIS — S6992XA Unspecified injury of left wrist, hand and finger(s), initial encounter: Secondary | ICD-10-CM

## 2020-07-27 DIAGNOSIS — M25532 Pain in left wrist: Secondary | ICD-10-CM | POA: Diagnosis not present

## 2020-07-27 MED ORDER — MELOXICAM 15 MG PO TABS: ORAL_TABLET | ORAL | 3 refills | Status: DC

## 2020-07-27 NOTE — Assessment & Plan Note (Addendum)
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 He had immediate pain, but no bruising and was able to use his hand. Unfortunately 2 months later he has continued to have discomfort, vague and deep within the wrist. We discussed the anatomy and potential injuries, I do suspect scapholunate or lunotriquetral ligament injury. We are going to proceed with x-rays, thumb spica bracing for a solid month. Meloxicam. If insufficient improvement after a month we will proceed with MR arthrography.

## 2020-07-27 NOTE — Patient Instructions (Signed)
Scapholunate Dissociation  Scapholunate dissociation is a type of wrist injury. The wrist is made up of eight bones that are arranged in two rows. In the row of bones that is nearest the bones of the forearm, two bones, the scaphoid bone and the lunate bone, are connected and held in place by a tough band of tissue (scapholunate ligament). Scapholunate dissociation means that this ligament has been torn and the bones have been separated. This injury often happens at the same time that a wrist bone is broken. This condition can cause wrist pain and weakness. What are the causes? This condition is usually caused by:  A sudden injury, like a hard fall onto an outstretched hand. The ligament can tear if your hand bends back too far or your wrist twists forcefully.  Long-term wear and tear on the ligament from overuse. This can happen if you repeatedly use your wrist at work or during sporting activities. What increases the risk? The following factors may make you more likely to develop this injury:  Being older or having a higher risk of falling.  Doing sports that involve a high risk of falling. These sports include skating, running, and contact sports.  Using your wrist forcefully at work or in sports.  Having gout or rheumatoid arthritis. What are the signs or symptoms? The main symptom of this condition is pain, especially when you bend your wrist. Other symptoms include:  A tearing sensation and a noise, like a pop or a snap, if you fall on your hand.  Swelling.  Tenderness.  Weakness and stiffness.  Bruising.  A clicking sound when you move your wrist. How is this diagnosed? This condition may be diagnosed based on:  Your symptoms and a physical exam. Your health care provider will check for tenderness and pain on your wrist.  Your medical history, especially if you have had a recent injury.  Imaging studies, such as X-rays or an MRI.  Arthroscopy. This uses a device  with a light and camera to check your injury. Your injury may also be repaired using this device. How is this treated? Treatment for this condition depends on the severity of your tear.  For a partial tear, you may have nonsurgical treatments at first. These may include: ? Wearing a cast or splint on your wrist. ? Icing your wrist. ? Taking NSAIDs, such as ibuprofen, to reduce pain and inflammation. ? Starting stretching and strengthening exercises (physical therapy) after your cast or splint is removed.  For a complete tear, you may need wrist surgery. Surgery may also be done if previous treatments have not worked. Follow these instructions at home: If you have a splint:  Wear it as told by your health care provider. Remove it only as told by your health care provider.  Loosen it if your fingers tingle, become numb, or turn cold and blue. If you have a cast:  Do not stick anything inside it to scratch your skin. Doing that increases your risk of infection.  You may put lotion on dry skin around the edges of the cast. Do not put lotion on the skin underneath it. If you have a cast or splint:  Do not put pressure on any part of the cast or splint until it is fully hardened. This may take several hours.  Check the skin around it every day. Tell your health care provider about any concerns.  Keep it clean and dry.  If the cast or splint is not waterproof: ?  Do not let it get wet. ? Cover it with a waterproof covering when you take a bath or shower. Managing pain, stiffness, and swelling   If directed, put ice on your wrist area. ? If you have a removable splint, remove it as told by your health care provider. ? Put ice in a plastic bag. ? Place a towel between your skin and the bag or between your cast and the bag. ? Leave the ice on for 20 minutes, 2-3 times a day.  Move your fingers often to reduce stiffness and swelling.  Raise (elevate) your wrist above the level of your  heart while you are sitting or lying down. Activity  Do not lift anything that is heavier than 10 lb (4.5 kg), or the limit that you are told, until your health care provider says that it is safe.  Return to your normal activities as told by your health care provider. Ask your health care provider what activities are safe for you.  Ask your health care provider when it is safe to drive if you have a cast or splint on your arm.  Do exercises only as told by your health care provider. General instructions  Take over-the-counter and prescription medicines only as told by your health care provider.  Do not use any products that contain nicotine or tobacco, such as cigarettes, e-cigarettes, and chewing tobacco. These can delay bone healing. If you need help quitting, ask your health care provider.  Keep all follow-up visits as told by your health care provider. This is important. These may include visits with a physical therapist. Contact a health care provider if:  Your symptoms do not get better or they get worse.  You have pain, numbness, or coldness in your hand or fingers.  Your cast or splint becomes loose or damaged. Get help right away if:  You lose feeling in your fingers or hand.  Your fingers or fingernails turn blue or gray. Summary  Scapholunate dissociation means that the ligament connecting the scaphoid and lunate bones has been torn, and the bones have been separated. This injury often happens at the same time that a wrist bone is broken.  This condition can cause wrist pain and weakness.  This condition is usually caused by a sudden injury, like a hard fall onto an outstretched hand. It can also be caused by long-term wear and tear on the ligament from overuse.  Treatment for this condition depends on the severity of your tear.  It is treated with rest, ice, medicines, physical therapy, and, if needed, surgery. This information is not intended to replace advice  given to you by your health care provider. Make sure you discuss any questions you have with your health care provider. Document Revised: 08/29/2018 Document Reviewed: 08/29/2018 Elsevier Patient Education  2020 [employer].

## 2020-07-27 NOTE — Progress Notes (Signed)
    Procedures performed today:    None.  Independent interpretation of notes and tests performed by another provider:   None.  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 He had immediate pain, but no bruising and was able to use his hand. Unfortunately 2 months later he has continued to have discomfort, vague and deep within the wrist. We discussed the anatomy and potential injuries, I do suspect scapholunate or lunotriquetral ligament injury. We are going to proceed with x-rays, thumb spica bracing for a solid month. Meloxicam. If insufficient improvement after a month we will proceed with MR arthrography.    ___________________________________________ Ihor Austin. Benjamin Stain, M.D., ABFM., CAQSM. Primary Care and Sports Medicine [facility]  Adjunct Instructor of Family Medicine  [employer]

## 2020-08-24 ENCOUNTER — Ambulatory Visit (INDEPENDENT_AMBULATORY_CARE_PROVIDER_SITE_OTHER): Payer: BC Managed Care – PPO

## 2020-08-24 ENCOUNTER — Other Ambulatory Visit: Payer: Self-pay

## 2020-08-24 ENCOUNTER — Ambulatory Visit (INDEPENDENT_AMBULATORY_CARE_PROVIDER_SITE_OTHER): Payer: BC Managed Care – PPO | Admitting: Sports Medicine

## 2020-08-24 DIAGNOSIS — S6992XD Unspecified injury of left wrist, hand and finger(s), subsequent encounter: Secondary | ICD-10-CM | POA: Diagnosis not present

## 2020-08-24 NOTE — Progress Notes (Signed)
    Procedures performed today:    Procedure: Real-time Ultrasound Guided injection of the left radiocarpal joint Device: Samsung HS60  Verbal informed consent obtained.  Time-out conducted.  Noted no overlying erythema, induration, or other signs of local infection.  Skin prepped in a sterile fashion.  Local anesthesia: Topical Ethyl chloride.  With sterile technique and under real time ultrasound guidance: 1 cc Kenalog 40, 1 cc lidocaine injected easily Completed without difficulty  Advised to call if fevers/chills, erythema, induration, drainage, or persistent bleeding.  Images permanently stored and available for review in PACS.  Impression: Technically successful ultrasound guided injection.  Independent interpretation of notes and tests performed by another provider:   None.  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 About 3 months ago he fell backwards and the bar fell onto his hand as his elbow was directly on the floor essentially causing a FOOSH type injury. He had immediate pain but no bruising and was able to use his hand, discomfort is vague, deep within the wrist, I did suspect scapholunate or lunotriquetral ligamentous injury, x-rays were unremarkable, he did thumb spica bracing for a month. He has noted about 70% improvement but continues to have discomfort, the initial plan was going to be MR arthrography but cost are definitely a concern here, so we will try a steroid injection into the radiocarpal joint. I like to see him back in about a month just to see how things are going and if insufficient improvement we need to have another discussion regarding MR arthrography. I am only going to code the procedure today to help him with cost.    ___________________________________________ Logan Mcguire.  Logan Mcguire, M.D., ABFM., CAQSM. Primary Care and Sports Medicine [facility]  Adjunct Instructor of Family Medicine  [employer]

## 2020-08-24 NOTE — Assessment & Plan Note (Signed)
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 About 3 months ago he fell backwards and the bar fell onto his hand as his elbow was directly on the floor essentially causing a FOOSH type injury. He had immediate pain but no bruising and was able to use his hand, discomfort is vague, deep within the wrist, I did suspect scapholunate or lunotriquetral ligamentous injury, x-rays were unremarkable, he did thumb spica bracing for a month. He has noted about 70% improvement but continues to have discomfort, the initial plan was going to be MR arthrography but cost are definitely a concern here, so we will try a steroid injection into the radiocarpal joint. I like to see him back in about a month just to see how things are going and if insufficient improvement we need to have another discussion regarding MR arthrography. I am only going to code the procedure today to help him with cost.

## 2021-01-21 ENCOUNTER — Ambulatory Visit (INDEPENDENT_AMBULATORY_CARE_PROVIDER_SITE_OTHER): Payer: BC Managed Care – PPO | Admitting: Family Medicine

## 2021-01-21 ENCOUNTER — Other Ambulatory Visit: Payer: Self-pay

## 2021-01-21 ENCOUNTER — Encounter: Payer: Self-pay | Admitting: Family Medicine

## 2021-01-21 VITALS — BP 132/88 | HR 80 | Temp 98.2°F | Resp 16 | Wt 216.3 lb

## 2021-01-21 DIAGNOSIS — R1013 Epigastric pain: Secondary | ICD-10-CM

## 2021-01-21 DIAGNOSIS — R110 Nausea: Secondary | ICD-10-CM | POA: Diagnosis not present

## 2021-01-21 DIAGNOSIS — R5383 Other fatigue: Secondary | ICD-10-CM

## 2021-01-21 LAB — CBC WITH DIFFERENTIAL/PLATELET
Absolute Monocytes: 515 cells/uL (ref 200–950)
Basophils Absolute: 21 cells/uL (ref 0–200)
Basophils Relative: 0.2 %
Eosinophils Absolute: 52 cells/uL (ref 15–500)
Eosinophils Relative: 0.5 %
HCT: 46.3 % (ref 38.5–50.0)
Hemoglobin: 15.6 g/dL (ref 13.2–17.1)
Lymphs Abs: 361 cells/uL — ABNORMAL LOW (ref 850–3900)
MCH: 29.3 pg (ref 27.0–33.0)
MCHC: 33.7 g/dL (ref 32.0–36.0)
MCV: 87 fL (ref 80.0–100.0)
MPV: 10.6 fL (ref 7.5–12.5)
Monocytes Relative: 5 %
Neutro Abs: 9352 cells/uL — ABNORMAL HIGH (ref 1500–7800)
Neutrophils Relative %: 90.8 %
Platelets: 228 10*3/uL (ref 140–400)
RBC: 5.32 10*6/uL (ref 4.20–5.80)
RDW: 12 % (ref 11.0–15.0)
Total Lymphocyte: 3.5 %
WBC: 10.3 10*3/uL (ref 3.8–10.8)

## 2021-01-21 LAB — TSH: TSH: 1.38 mIU/L (ref 0.40–4.50)

## 2021-01-21 LAB — LIPASE: Lipase: 27 U/L (ref 7–60)

## 2021-01-21 MED ORDER — ONDANSETRON 8 MG PO TBDP
8.0000 mg | ORAL_TABLET | Freq: Three times a day (TID) | ORAL | 3 refills | Status: DC | PRN
  Filled 2021-01-21: qty 20, 7d supply, fill #0

## 2021-01-21 NOTE — Patient Instructions (Signed)
Labs today. Bland diet. Zofran sent in for nausea. Can try Imodium, Pepto Bismol for diarrhea/bloating. If diarrhea persists, we can have you submit a sample to the lab.

## 2021-01-21 NOTE — Progress Notes (Signed)
Acute Office Visit  Subjective:    Patient ID: Logan Mcguire, male    DOB: [birth year], 30 y.o.   MRN: [id number]  Chief Complaint  Patient presents with   Fatigue   Abdominal Pain    HPI Patient is in today for fatigue, abdominal, pain, n/v/d.  Logan Mcguire woke up around 4 am this morning with upper abdominal discomfort (5/10, bloating, fullness sensation) followed by some nausea and one episode of diarrhea. He felt like he would feel better if he vomited so he tried, but was only able to vomit a small amount. No further episodes of vomiting/diarrhea today so far, but continues to have ongoing nausea and epigastric bloating and decreased appetite. Denies radiation, fevers, severe pain, body aches, blood in urine/stool, heartburn/indigestion, constipation. He states he has felt cold all morning, but no fevers. Reports that he usually eats very clean, however, last night they went out to eat [place] and he had a heavy meal with a lot of dairy which could be a contributing factor.   Additionally, patient reports almost a month of feeling unusually fatigued and lethargic. Denies any weight changes, skin/nail changes, bleeding, etc. He has been under some stress lately: started a new job in November, new baby in January, and currently trying to sell house.  He reports his dad tested positive for COVID on Tuesday (patient was with him on Sunday). Patient had a negative COVID test last night and again this morning.   States he has regular exercise, healthy diet, and only drinks alcohol a few times per month.   Past Medical History:  Diagnosis Date   History of high blood pressure    History of high cholesterol     No past surgical history on file.  Family History  Problem Relation Age of Onset   Heart attack Maternal Grandmother    Diabetes Maternal Grandmother    High blood pressure Maternal Grandmother    High Cholesterol Maternal Grandmother    Heart attack Maternal Grandfather    Diabetes  Maternal Grandfather    High blood pressure Maternal Grandfather    High Cholesterol Maternal Grandfather    Lung cancer Paternal Grandfather    Prostate cancer Paternal Grandfather     Social History   Socioeconomic History   Marital status: Married    Spouse name: Logan Mcguire   Number of children: 0   Years of education: Not on file   Highest education level: Bachelor's degree (e.g., BA, AB, BS)  Occupational History   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   Tobacco Use   Smoking status: Never   Smokeless tobacco: Never  Vaping Use   Vaping Use: Never used  Substance and Sexual Activity   Alcohol use: Yes    Alcohol/week: 0.0 standard drinks    Comment: Socially   Drug use: No   Sexual activity: Yes    Partners: Female    Birth control/protection: None  Other Topics Concern   Not on file  Social History Narrative   Not on file   Social Determinants of Health   Financial Resource Strain: Not on file  Food Insecurity: Not on file  Transportation Needs: Not on file  Physical Activity: Not on file  Stress: Not on file  Social Connections: Not on file  Intimate Partner Violence: Not on file    Outpatient Medications Prior to Visit  Medication Sig Dispense Refill   ketoconazole (NIZORAL) 2 % shampoo Apply 1 application topically  2 (two) times a week. 120 mL 2   meloxicam (MOBIC) 15 MG tablet One tab PO qAM with a meal for 2 weeks, then daily prn pain. 30 tablet 3   No facility-administered medications prior to visit.    No Known Allergies  Review of Systems All review of systems negative except what is listed in the HPI     Objective:    Physical Exam Constitutional:      Appearance: He is well-developed and normal weight.  HENT:     Head: Normocephalic and atraumatic.  Cardiovascular:     Rate and Rhythm: Normal rate and regular rhythm.  Pulmonary:     Breath sounds: Normal breath sounds.  Abdominal:     General:  Abdomen is flat. Bowel sounds are normal.     Palpations: Abdomen is soft.     Tenderness: There is no abdominal tenderness. There is no guarding or rebound. Negative signs include Murphy's sign and McBurney's sign.     Hernia: No hernia is present.  Skin:    General: Skin is warm and dry.     Findings: No rash.  Neurological:     Mental Status: He is alert and oriented to person, place, and time.  Psychiatric:        Mood and Affect: Mood normal.        Behavior: Behavior normal.    BP 132/88   Pulse 80   Temp 98.2 F (36.8 C)   Resp 16   Wt 216 lb 4.8 oz (98.1 kg)   SpO2 100%   BMI 26.33 kg/m  Wt Readings from Last 3 Encounters:  01/21/21 216 lb 4.8 oz (98.1 kg)  06/08/20 208 lb 0.6 oz (94.4 kg)  04/27/20 212 lb 11.2 oz (96.5 kg)    Health Maintenance Due  Topic Date Due   HIV Screening  Never done   Hepatitis C Screening  Never done   COVID-19 Vaccine (3 - Booster) 05/01/2020    There are no preventive care reminders to display for this patient.   No results found for: TSH Lab Results  Component Value Date   WBC 4.8 06/04/2020   HGB 15.7 06/04/2020   HCT 45.7 06/04/2020   MCV 87.9 06/04/2020   PLT 233 06/04/2020   Lab Results  Component Value Date   NA 138 06/04/2020   K 4.0 06/04/2020   CO2 30 06/04/2020   GLUCOSE 81 06/04/2020   BUN 19 06/04/2020   CREATININE 1.17 06/04/2020   BILITOT 1.1 06/04/2020   AST 18 06/04/2020   ALT 13 06/04/2020   PROT 6.5 06/04/2020   CALCIUM 9.6 06/04/2020   Lab Results  Component Value Date   CHOL 208 (H) 06/04/2020   Lab Results  Component Value Date   HDL 56 06/04/2020   Lab Results  Component Value Date   LDLCALC 130 (H) 06/04/2020   Lab Results  Component Value Date   TRIG 111 06/04/2020   Lab Results  Component Value Date   CHOLHDL 3.7 06/04/2020   No results found for: HGBA1C     Assessment & Plan:   1. Epigastric pain 2. Nausea  No alarm findings on exam today. Possibly gastroenteritis  or related to heavy meal last night. Given location of discomfort, will go ahead and check lipase. Zofran given for nausea. Can try some Imodium or Pepto for diarrhea. Encouraged BRAT diet and adequate hydration. Patient aware of signs/symptoms requiring further/urgent evaluation. If diarrhea persists (only one episode so  far) can culture stool.  - CBC with Differential - Lipase - ondansetron (ZOFRAN-ODT) 8 MG disintegrating tablet; Take 1 tablet (8 mg total) by mouth every 8 (eight) hours as needed for nausea.  Dispense: 20 tablet; Refill: 3   2. Other fatigue  One month of fatigue. Several contributing factors possible, but will go ahead check labs to rule out anemias and thyroid dysfunction. Will updated patient with results and any changes to plan.   - CBC with Differential - TSH   Follow-up pending results or as needed.   Clayborne Dana, NP

## 2021-01-22 ENCOUNTER — Encounter: Payer: Self-pay | Admitting: Osteopathic Medicine

## 2021-01-22 DIAGNOSIS — D729 Disorder of white blood cells, unspecified: Secondary | ICD-10-CM

## 2021-01-22 NOTE — Telephone Encounter (Signed)
Pt left a vm msg stating he is due to travel to [place] for a baby shower. He mentioned he is doing a lot better. Requesting if he needs to remain in quarantine or if he can travel since labs/results were negative/normal.

## 2021-01-22 NOTE — Progress Notes (Signed)
MyChart message sent - Overall WBC was normal, but neutrophils and lymphocytes were off indicating some inflammation. The rest of your labs were normal. How are you feeling? If improving, we can just repeat this lab in a week or two to see if it trends back to normal.

## 2021-01-26 ENCOUNTER — Other Ambulatory Visit (HOSPITAL_COMMUNITY): Payer: Self-pay

## 2021-02-11 ENCOUNTER — Other Ambulatory Visit: Payer: Self-pay | Admitting: Family Medicine

## 2021-02-11 DIAGNOSIS — D729 Disorder of white blood cells, unspecified: Secondary | ICD-10-CM | POA: Diagnosis not present

## 2021-02-11 LAB — CBC WITH DIFFERENTIAL/PLATELET
Absolute Monocytes: 462 cells/uL (ref 200–950)
Basophils Absolute: 51 cells/uL (ref 0–200)
Basophils Relative: 0.9 %
Eosinophils Absolute: 91 cells/uL (ref 15–500)
Eosinophils Relative: 1.6 %
HCT: 44.5 % (ref 38.5–50.0)
Hemoglobin: 15.4 g/dL (ref 13.2–17.1)
Lymphs Abs: 2075 cells/uL (ref 850–3900)
MCH: 30.4 pg (ref 27.0–33.0)
MCHC: 34.6 g/dL (ref 32.0–36.0)
MCV: 87.9 fL (ref 80.0–100.0)
MPV: 10.5 fL (ref 7.5–12.5)
Monocytes Relative: 8.1 %
Neutro Abs: 3021 cells/uL (ref 1500–7800)
Neutrophils Relative %: 53 %
Platelets: 253 10*3/uL (ref 140–400)
RBC: 5.06 10*6/uL (ref 4.20–5.80)
RDW: 12.3 % (ref 11.0–15.0)
Total Lymphocyte: 36.4 %
WBC: 5.7 10*3/uL (ref 3.8–10.8)

## 2021-02-11 NOTE — Progress Notes (Signed)
MyChart message sent: Repeat blood work was perfect!

## 2021-03-15 ENCOUNTER — Encounter: Payer: Self-pay | Admitting: Osteopathic Medicine

## 2021-04-01 ENCOUNTER — Ambulatory Visit (INDEPENDENT_AMBULATORY_CARE_PROVIDER_SITE_OTHER): Payer: BC Managed Care – PPO | Admitting: Osteopathic Medicine

## 2021-04-01 ENCOUNTER — Encounter: Payer: Self-pay | Admitting: Osteopathic Medicine

## 2021-04-01 ENCOUNTER — Other Ambulatory Visit: Payer: Self-pay

## 2021-04-01 VITALS — BP 121/81 | HR 75 | Temp 97.8°F | Wt 210.0 lb

## 2021-04-01 DIAGNOSIS — Z711 Person with feared health complaint in whom no diagnosis is made: Secondary | ICD-10-CM | POA: Diagnosis not present

## 2021-04-01 DIAGNOSIS — D649 Anemia, unspecified: Secondary | ICD-10-CM

## 2021-04-01 NOTE — Progress Notes (Signed)
Logan Mcguire is a 30 y.o. male who presents to  [facility] at [facility]  today, 04/02/21, seeking care for the following:  Concern for anemia - donated blood and was told low iron counts, also reports some fatigue, notes he stopped eating much red meat.  Since blood donation he has started iron supplementation, questions if he still needs to be taking this.  He reports some left lower quadrant abdominal discomfort on occasion and darker stools/constipation.     ASSESSMENT & PLAN with other pertinent findings:  The encounter diagnosis was Physically well but worried.  Reassured that given recent CBC counts normal hemoglobin, I think ever he donated blood there may have been some lab error.  Given normal hemoglobin I do not have any strong suspicion for iron deficiency.  Advised if he has any significant dietary restrictions, a multivitamin with low-dose iron might be appropriate, but it sounds like he is getting more side effects from the iron supplementation than its worth and advised him to stop taking these or to switch to every other day/taking with orange juice.    There are no Patient Instructions on file for this visit.  No orders of the defined types were placed in this encounter.   No orders of the defined types were placed in this encounter.    See below for relevant physical exam findings  See below for recent lab and imaging results reviewed  Medications, allergies, PMH, PSH, SocH, FamH reviewed below    Follow-up instructions: No follow-ups on file.                                        Exam:  BP 121/81 (BP Location: Left Arm, Patient Position: Sitting, Cuff Size: Large)   Pulse 75   Temp 97.8 F (36.6 C) (Oral)   Wt 210 lb 0.6 oz (95.3 kg)   BMI 25.57 kg/m  Constitutional: VS see above. General Appearance: alert, well-developed, well-nourished, NAD Neck: No masses, trachea midline.   Respiratory: Normal respiratory effort. no wheeze, no rhonchi, no rales Cardiovascular: S1/S2 normal, no murmur, no rub/gallop auscultated. RRR.  Musculoskeletal: Gait normal. Symmetric and independent movement of all extremities Abdominal: non-tender, non-distended, no appreciable organomegaly, neg Murphy's, BS [id number] Neurological: Normal balance/coordination. No tremor. Skin: warm, dry, intact.  Psychiatric: Normal judgment/insight. Normal mood and affect. Oriented x3.   Current Meds  Medication Sig   ketoconazole (NIZORAL) 2 % shampoo Apply 1 application topically 2 (two) times a week.   ondansetron (ZOFRAN-ODT) 8 MG disintegrating tablet Take 1 tablet (8 mg total) by mouth every 8 (eight) hours as needed for nausea.    No Known Allergies  Patient Active Problem List   Diagnosis Date Noted   Left wrist injury 07/27/2020   Chronic headaches 05/11/2020   Neck pain 04/27/2020   Quadriceps tendonitis 12/04/2017   Medial epicondylitis 12/04/2017   Left knee pain 09/13/2017   Left lower quadrant abdominal pain of unknown etiology 09/13/2017    Family History  Problem Relation Age of Onset   Heart attack Maternal Grandmother    Diabetes Maternal Grandmother    High blood pressure Maternal Grandmother    High Cholesterol Maternal Grandmother    Heart attack Maternal Grandfather    Diabetes Maternal Grandfather    High blood pressure Maternal Grandfather    High Cholesterol Maternal Grandfather    Lung  cancer Paternal Grandfather    Prostate cancer Paternal Grandfather     Social History   Tobacco Use  Smoking Status Never  Smokeless Tobacco Never    No past surgical history on file.  Immunization History  Administered Date(s) Administered   Influenza,inj,Quad PF,6+ Mos 06/18/2018, 05/18/2019, 06/08/2020   Tdap 01/02/2018   Unspecified SARS-COV-2 Vaccination 11/09/2019, 11/30/2019    Recent Results (from the past 2160 hour(s))  CBC with Differential     Status:  Abnormal   Collection Time: 01/21/21 12:00 AM  Result Value Ref Range   WBC 10.3 3.8 - 10.8 Thousand/uL   RBC 5.32 4.20 - 5.80 Million/uL   Hemoglobin 15.6 13.2 - 17.1 g/dL   HCT 63.8 75.6 - 43.3 %   MCV 87.0 80.0 - 100.0 fL   MCH 29.3 27.0 - 33.0 pg   MCHC 33.7 32.0 - 36.0 g/dL   RDW 29.5 18.8 - 41.6 %   Platelets 228 140 - 400 Thousand/uL   MPV 10.6 7.5 - 12.5 fL   Neutro Abs 9,352 (H) 1,500 - 7,800 cells/uL   Lymphs Abs 361 (L) 850 - 3,900 cells/uL   Absolute Monocytes 515 200 - 950 cells/uL   Eosinophils Absolute 52 15 - 500 cells/uL   Basophils Absolute 21 0 - 200 cells/uL   Neutrophils Relative % 90.8 %   Total Lymphocyte 3.5 %   Monocytes Relative 5.0 %   Eosinophils Relative 0.5 %   Basophils Relative 0.2 %  TSH     Status: None   Collection Time: 01/21/21 12:00 AM  Result Value Ref Range   TSH 1.38 0.40 - 4.50 mIU/L  Lipase     Status: None   Collection Time: 01/21/21 12:00 AM  Result Value Ref Range   Lipase 27 7 - 60 U/L  CBC with Differential/Platelet     Status: None   Collection Time: 02/11/21 12:00 AM  Result Value Ref Range   WBC 5.7 3.8 - 10.8 Thousand/uL   RBC 5.06 4.20 - 5.80 Million/uL   Hemoglobin 15.4 13.2 - 17.1 g/dL   HCT 60.6 30.1 - 60.1 %   MCV 87.9 80.0 - 100.0 fL   MCH 30.4 27.0 - 33.0 pg   MCHC 34.6 32.0 - 36.0 g/dL   RDW 09.3 23.5 - 57.3 %   Platelets 253 140 - 400 Thousand/uL   MPV 10.5 7.5 - 12.5 fL   Neutro Abs 3,021 1,500 - 7,800 cells/uL   Lymphs Abs 2,075 850 - 3,900 cells/uL   Absolute Monocytes 462 200 - 950 cells/uL   Eosinophils Absolute 91 15 - 500 cells/uL   Basophils Absolute 51 0 - 200 cells/uL   Neutrophils Relative % 53 %   Total Lymphocyte 36.4 %   Monocytes Relative 8.1 %   Eosinophils Relative 1.6 %   Basophils Relative 0.9 %    No results found.     All questions at time of visit were answered - patient instructed to contact office with any additional concerns or updates. ER/RTC precautions were reviewed  with the patient as applicable.   Please note: manual typing as well as voice recognition software may have been used to produce this document - typos may escape review. Please contact Dr. Lyn Hollingshead for any needed clarifications.

## 2021-06-09 ENCOUNTER — Encounter: Payer: Managed Care, Other (non HMO) | Admitting: Osteopathic Medicine

## 2021-06-14 ENCOUNTER — Ambulatory Visit (INDEPENDENT_AMBULATORY_CARE_PROVIDER_SITE_OTHER): Payer: BC Managed Care – PPO | Admitting: Family Medicine

## 2021-06-14 ENCOUNTER — Encounter: Payer: Self-pay | Admitting: Family Medicine

## 2021-06-14 ENCOUNTER — Other Ambulatory Visit: Payer: Self-pay

## 2021-06-14 VITALS — BP 139/74 | HR 83 | Temp 97.6°F | Wt 220.1 lb

## 2021-06-14 DIAGNOSIS — Z0000 Encounter for general adult medical examination without abnormal findings: Secondary | ICD-10-CM | POA: Diagnosis not present

## 2021-06-14 DIAGNOSIS — E785 Hyperlipidemia, unspecified: Secondary | ICD-10-CM | POA: Diagnosis not present

## 2021-06-14 DIAGNOSIS — Z13220 Encounter for screening for lipoid disorders: Secondary | ICD-10-CM

## 2021-06-14 DIAGNOSIS — Z23 Encounter for immunization: Secondary | ICD-10-CM | POA: Diagnosis not present

## 2021-06-14 NOTE — Progress Notes (Signed)
BP 139/74 (BP Location: Left Arm, Patient Position: Sitting, Cuff Size: Large)   Pulse 83   Temp 97.6 F (36.4 C) (Oral)   Wt 220 lb 1.3 oz (99.8 kg)   BMI 26.79 kg/m    Subjective:    Patient ID: Logan Mcguire, male    DOB: January 21, 1991, 30 y.o.   MRN: [id number]  HPI: Logan Mcguire is a 30 y.o. male presenting on 06/14/2021 for comprehensive medical examination. Current medical complaints include:none  He currently lives with: wife and baby Interim Problems from his last visit: no  He reports regular vision exams q1-5y: no He reports regular dental exams q 17m: yes His diet consists of: clean eating, well-balanced He endorses exercise and/or activity of: cross fit 6 days per week He works at: Regulatory affairs officer for [employer]   He endorses ETOH use - occasionally on weekends He denies nictoine use  He denies illegal substance use   He is currently sexually active with  one male  partners. He denies concerns today about STI  He denies concerns about skin changes today:  He denies concerns about bowel changes today:  He denies concerns about bladder changes today:   Depression Screen done today and results listed below:  Depression screen [facility] 2/9 06/14/2021 06/05/2019 01/02/2018  Decreased Interest 0 0 0  Down, Depressed, Hopeless 0 0 0  PHQ - 2 Score 0 0 0  Altered sleeping - - 0  Tired, decreased energy - - 0  Change in appetite - - 0  Feeling bad or failure about yourself  - - 0  Trouble concentrating - - 0  Moving slowly or fidgety/restless - - 0  Suicidal thoughts - - 0  PHQ-9 Score - - 0    The patient does not have a history of falls.      Past Medical History:  Past Medical History:  Diagnosis Date   History of high blood pressure    History of high cholesterol     Surgical History:  No past surgical history on file.  Medications:  Current Outpatient Medications on File Prior to Visit  Medication Sig   ketoconazole (NIZORAL) 2 % shampoo  Apply 1 application topically 2 (two) times a week.   ondansetron (ZOFRAN-ODT) 8 MG disintegrating tablet Take 1 tablet (8 mg total) by mouth every 8 (eight) hours as needed for nausea.   meloxicam (MOBIC) 15 MG tablet One tab PO qAM with a meal for 2 weeks, then daily prn pain. (Patient not taking: No sig reported)   No current facility-administered medications on file prior to visit.    Allergies:  No Known Allergies  Social History:  Social History   Socioeconomic History   Marital status: Married    Spouse name: Stephan Nelis   Number of children: 0   Years of education: Not on file   Highest education level: Bachelor's degree (e.g., BA, AB, BS)  Occupational History   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   Tobacco Use   Smoking status: Never   Smokeless tobacco: Never  Vaping Use   Vaping Use: Never used  Substance and Sexual Activity   Alcohol use: Yes    Alcohol/week: 0.0 standard drinks    Comment: Socially   Drug use: No   Sexual activity: Yes    Partners: Female    Birth control/protection: None  Other Topics Concern   Not on file  Social History Narrative   Not  on file   Social Determinants of Health   Financial Resource Strain: Not on file  Food Insecurity: Not on file  Transportation Needs: Not on file  Physical Activity: Not on file  Stress: Not on file  Social Connections: Not on file  Intimate Partner Violence: Not on file   Social History   Tobacco Use  Smoking Status Never  Smokeless Tobacco Never   Social History   Substance and Sexual Activity  Alcohol Use Yes   Alcohol/week: 0.0 standard drinks   Comment: Socially    Family History:  Family History  Problem Relation Age of Onset   Heart attack Maternal Grandmother    Diabetes Maternal Grandmother    High blood pressure Maternal Grandmother    High Cholesterol Maternal Grandmother    Heart attack Maternal Grandfather    Diabetes Maternal  Grandfather    High blood pressure Maternal Grandfather    High Cholesterol Maternal Grandfather    Lung cancer Paternal Grandfather    Prostate cancer Paternal Grandfather     Past medical history, surgical history, medications, allergies, family history and social history reviewed with patient today and changes made to appropriate areas of the chart.   All ROS negative except what is listed above and in the HPI.      Objective:    BP 139/74 (BP Location: Left Arm, Patient Position: Sitting, Cuff Size: Large)   Pulse 83   Temp 97.6 F (36.4 C) (Oral)   Wt 220 lb 1.3 oz (99.8 kg)   BMI 26.79 kg/m   Wt Readings from Last 3 Encounters:  06/14/21 220 lb 1.3 oz (99.8 kg)  04/01/21 210 lb 0.6 oz (95.3 kg)  01/21/21 216 lb 4.8 oz (98.1 kg)    Physical Exam  Results for orders placed or performed in visit on 02/11/21  CBC with Differential/Platelet  Result Value Ref Range   WBC 5.7 3.8 - 10.8 Thousand/uL   RBC 5.06 4.20 - 5.80 Million/uL   Hemoglobin 15.4 13.2 - 17.1 g/dL   HCT 31.5 17.6 - 16.0 %   MCV 87.9 80.0 - 100.0 fL   MCH 30.4 27.0 - 33.0 pg   MCHC 34.6 32.0 - 36.0 g/dL   RDW 73.7 10.6 - 26.9 %   Platelets 253 140 - 400 Thousand/uL   MPV 10.5 7.5 - 12.5 fL   Neutro Abs 3,021 1,500 - 7,800 cells/uL   Lymphs Abs 2,075 850 - 3,900 cells/uL   Absolute Monocytes 462 200 - 950 cells/uL   Eosinophils Absolute 91 15 - 500 cells/uL   Basophils Absolute 51 0 - 200 cells/uL   Neutrophils Relative % 53 %   Total Lymphocyte 36.4 %   Monocytes Relative 8.1 %   Eosinophils Relative 1.6 %   Basophils Relative 0.9 %      Assessment & Plan:   Problem List Items Addressed This Visit   None Visit Diagnoses     Annual physical exam    -  Primary   Relevant Orders   Lipid panel   CBC with Differential/Platelet   Comprehensive metabolic panel   Screening, lipid       Relevant Orders   Lipid panel         LABORATORY TESTING:  Health maintenance labs ordered today as  discussed above.  - STI testing: deferred   IMMUNIZATIONS:   - Tdap: Tetanus vaccination status reviewed: last tetanus booster within 10 years. - Influenza: Administered today - Pneumovax: Not applicable - Prevnar: Not  applicable - HPV: Not applicable - Shingrix vaccine: Not applicable - COVID-19: Not applicable  SCREENING: - Colonoscopy: Not applicable  Discussed with patient purpose of the colonoscopy is to detect colon cancer at curable precancerous or early stages  - AAA Screening: Not applicable  -Hearing Test: Not applicable  -Spirometry: Not applicable  - Lung Cancer Screening: Not applicable    PATIENT COUNSELING:    Sexuality: Discussed sexually transmitted diseases, partner selection, use of condoms, avoidance of unintended pregnancy, and contraceptive alternatives.   I discussed with the patient that most people either abstain from alcohol or drink within safe limits (<=14/week and <=4 drinks/occasion for males, <=7/weeks and <= 3 drinks/occasion for females) and that the risk for alcohol disorders and other health effects rises proportionally with the number of drinks per week and how often a drinker exceeds daily limits.  Discussed cessation/primary prevention of drug use and availability of treatment for abuse.   Diet: Encouraged to adjust caloric intake to maintain or achieve ideal body weight, to reduce intake of dietary saturated fat and total fat, to limit sodium intake by avoiding high sodium foods and not adding table salt, and to maintain adequate dietary potassium and calcium preferably from fresh fruits, vegetables, and low-fat dairy products.    Emphasized the importance of regular exercise  Injury prevention: Discussed safety belts, safety helmets, smoke detector, smoking near bedding or upholstery.   Dental health: Discussed importance of regular tooth brushing, flossing, and dental visits.   Follow up plan:  Return in about 1 year (around 06/14/2022)  for CPE and establish.   Lollie Marrow Reola Calkins, DNP, FNP-C

## 2021-06-14 NOTE — Addendum Note (Signed)
Addended by: Delfino Lovett on: 06/14/2021 09:04 AM   Modules accepted: Orders

## 2021-06-15 LAB — CBC WITH DIFFERENTIAL/PLATELET
Absolute Monocytes: 382 cells/uL (ref 200–950)
Basophils Absolute: 31 cells/uL (ref 0–200)
Basophils Relative: 0.8 %
Eosinophils Absolute: 121 cells/uL (ref 15–500)
Eosinophils Relative: 3.1 %
HCT: 43.6 % (ref 38.5–50.0)
Hemoglobin: 15 g/dL (ref 13.2–17.1)
Lymphs Abs: 1665 cells/uL (ref 850–3900)
MCH: 29.9 pg (ref 27.0–33.0)
MCHC: 34.4 g/dL (ref 32.0–36.0)
MCV: 86.9 fL (ref 80.0–100.0)
MPV: 10.7 fL (ref 7.5–12.5)
Monocytes Relative: 9.8 %
Neutro Abs: 1700 cells/uL (ref 1500–7800)
Neutrophils Relative %: 43.6 %
Platelets: 258 10*3/uL (ref 140–400)
RBC: 5.02 10*6/uL (ref 4.20–5.80)
RDW: 12 % (ref 11.0–15.0)
Total Lymphocyte: 42.7 %
WBC: 3.9 10*3/uL (ref 3.8–10.8)

## 2021-06-15 LAB — LIPID PANEL
Cholesterol: 204 mg/dL — ABNORMAL HIGH (ref <200)
HDL: 65 mg/dL (ref >=40)
LDL Cholesterol (Calc): 120 mg/dL (calc) — ABNORMAL HIGH
Non-HDL Cholesterol (Calc): 139 mg/dL (calc) — ABNORMAL HIGH (ref <130)
Total CHOL/HDL Ratio: 3.1 (calc) (ref <5.0)
Triglycerides: 87 mg/dL (ref <150)

## 2021-06-15 LAB — COMPREHENSIVE METABOLIC PANEL
AG Ratio: 2 (calc) (ref 1.0–2.5)
ALT: 22 U/L (ref 9–46)
AST: 35 U/L (ref 10–40)
Albumin: 4.5 g/dL (ref 3.6–5.1)
Alkaline phosphatase (APISO): 64 U/L (ref 36–130)
BUN: 16 mg/dL (ref 7–25)
CO2: 28 mmol/L (ref 20–32)
Calcium: 9.5 mg/dL (ref 8.6–10.3)
Chloride: 103 mmol/L (ref 98–110)
Creat: 1.13 mg/dL (ref 0.60–1.26)
Globulin: 2.2 g/dL (calc) (ref 1.9–3.7)
Glucose, Bld: 96 mg/dL (ref 65–99)
Potassium: 4.4 mmol/L (ref 3.5–5.3)
Sodium: 139 mmol/L (ref 135–146)
Total Bilirubin: 0.6 mg/dL (ref 0.2–1.2)
Total Protein: 6.7 g/dL (ref 6.1–8.1)

## 2021-10-26 ENCOUNTER — Telehealth: Payer: Self-pay | Admitting: Osteopathic Medicine

## 2021-10-26 NOTE — Telephone Encounter (Signed)
Provided [facility] with the code E78.5, hyperlipidemia.  They will correct and resubmit to insurance company.  Per [facility] CSR, advised pt to disregard current bill and that it usually takes 4 weeks for them to hear back from the insurance company.  Advised pt they will bill him if anything is owed after response from [employer].  Pt expressed understanding.  Tiajuana Amass, CMA ?

## 2021-10-26 NOTE — Telephone Encounter (Signed)
Per patient received a bill in the mail from [facility] stating the lipid panel code was coded incorrect from his appt on 06/14/21 with Caleen Jobs. Per patient he spoke with [facility] and was told to call the office to have the code updated and he would not receive a bill. Patient asked to be contacted once this has been updated.  ?

## 2021-12-07 ENCOUNTER — Telehealth: Payer: BC Managed Care – PPO | Admitting: Nurse Practitioner

## 2021-12-07 DIAGNOSIS — J029 Acute pharyngitis, unspecified: Secondary | ICD-10-CM | POA: Diagnosis not present

## 2021-12-07 NOTE — Progress Notes (Signed)
?E-Visit for Sore Throat ? ?We are sorry that you are not feeling well.  Here is how we plan to help! ? ?Your symptoms indicate a likely viral infection (Pharyngitis).   Pharyngitis is inflammation in the back of the throat which can cause a sore throat, scratchiness and sometimes difficulty swallowing.   Pharyngitis is typically caused by a respiratory virus and will just run its course.  Please keep in mind that your symptoms could last up to 10 days.  For throat pain, we recommend over the counter oral pain relief medications such as acetaminophen or aspirin, or anti-inflammatory medications such as ibuprofen or naproxen sodium.  Topical treatments such as oral throat lozenges or sprays may be used as needed.  Avoid close contact with loved ones, especially the very young and elderly.  Remember to wash your hands thoroughly throughout the day as this is the number one way to prevent the spread of infection and wipe down door knobs and counters with disinfectant. ? ?If you have been exposed to someone known to have strep throat let us know. ? ?After careful review of your answers, I would not recommend an antibiotic for your condition.  Antibiotics should not be used to treat conditions that we suspect are caused by viruses like the virus that causes the common cold or flu. However, some people can have Strep with atypical symptoms. You may need formal testing in clinic or office to confirm if your symptoms continue or worsen. ? ?Providers prescribe antibiotics to treat infections caused by bacteria. Antibiotics are very powerful in treating bacterial infections when they are used properly.  To maintain their effectiveness, they should be used only when necessary.  Overuse of antibiotics has resulted in the development of super bugs that are resistant to treatment!   ? ?Home Care: ?Only take medications as instructed by your medical team. ?Do not drink alcohol while taking these medications. ?A steam or  ultrasonic humidifier can help congestion.  You can place a towel over your head and breathe in the steam from hot water coming from a faucet. ?Avoid close contacts especially the very young and the elderly. ?Cover your mouth when you cough or sneeze. ?Always remember to wash your hands. ? ?Get Help Right Away If: ?You develop worsening fever or throat pain. ?You develop a severe head ache or visual changes. ?Your symptoms persist after you have completed your treatment plan. ? ?Make sure you ?Understand these instructions. ?Will watch your condition. ?Will get help right away if you are not doing well or get worse. ? ? ?Thank you for choosing an e-visit. ? ?Your e-visit answers were reviewed by a board certified advanced clinical practitioner to complete your personal care plan. Depending upon the condition, your plan could have included both over the counter or prescription medications. ? ?Please review your pharmacy choice. Make sure the pharmacy is open so you can pick up prescription now. If there is a problem, you may contact your provider through [employer] and have the prescription routed to another pharmacy.  Your safety is important to [place]. If you have drug allergies check your prescription carefully.  ? ?For the next 24 hours you can use MyChart to ask questions about today's visit, request a non-urgent call back, or ask for a work or school excuse. ?You will get an email in the next two days asking about your experience. I hope that your e-visit has been valuable and will speed your recovery.  ? ?I spent approximately  5 minutes reviewing the patient's history, current symptoms and coordinating their plan of care today.   ?

## 2022-06-15 ENCOUNTER — Encounter: Payer: BC Managed Care – PPO | Admitting: Family Medicine

## 2022-06-22 ENCOUNTER — Encounter: Payer: Self-pay | Admitting: Family Medicine

## 2022-06-22 ENCOUNTER — Ambulatory Visit (INDEPENDENT_AMBULATORY_CARE_PROVIDER_SITE_OTHER): Payer: BC Managed Care – PPO | Admitting: Family Medicine

## 2022-06-22 VITALS — BP 119/76 | HR 81 | Ht 76.0 in | Wt 218.9 lb

## 2022-06-22 DIAGNOSIS — Z0000 Encounter for general adult medical examination without abnormal findings: Secondary | ICD-10-CM | POA: Diagnosis not present

## 2022-06-22 DIAGNOSIS — Z23 Encounter for immunization: Secondary | ICD-10-CM | POA: Diagnosis not present

## 2022-06-22 DIAGNOSIS — Z13220 Encounter for screening for lipoid disorders: Secondary | ICD-10-CM

## 2022-06-22 NOTE — Assessment & Plan Note (Signed)
Well adult Orders Placed This Encounter  Procedures   Flu Vaccine QUAD 6+ mos PF IM (Fluarix Quad PF)   COMPLETE METABOLIC PANEL WITH GFR   CBC with Differential   Lipid Panel w/reflex Direct LDL  Screenings: Per lab orders Immunizations: Flu vaccine given today.  Anticipatory guidance/Risk factor reduction:  Recommendations per AVS.

## 2022-06-22 NOTE — Patient Instructions (Signed)
Preventive Care 21-31 Years Old, Male Preventive care refers to lifestyle choices and visits with your health care provider that can promote health and wellness. Preventive care visits are also called wellness exams. What can I expect for my preventive care visit? Counseling During your preventive care visit, your health care provider may ask about your: Medical history, including: Past medical problems. Family medical history. Current health, including: Emotional well-being. Home life and relationship well-being. Sexual activity. Lifestyle, including: Alcohol, nicotine or tobacco, and drug use. Access to firearms. Diet, exercise, and sleep habits. Safety issues such as seatbelt and bike helmet use. Sunscreen use. Work and work environment. Physical exam Your health care provider may check your: Height and weight. These may be used to calculate your BMI (body mass index). BMI is a measurement that tells if you are at a healthy weight. Waist circumference. This measures the distance around your waistline. This measurement also tells if you are at a healthy weight and may help predict your risk of certain diseases, such as type 2 diabetes and high blood pressure. Heart rate and blood pressure. Body temperature. Skin for abnormal spots. What immunizations do I need?  Vaccines are usually given at various ages, according to a schedule. Your health care provider will recommend vaccines for you based on your age, medical history, and lifestyle or other factors, such as travel or where you work. What tests do I need? Screening Your health care provider may recommend screening tests for certain conditions. This may include: Lipid and cholesterol levels. Diabetes screening. This is done by checking your blood sugar (glucose) after you have not eaten for a while (fasting). Hepatitis B test. Hepatitis C test. HIV (human immunodeficiency virus) test. STI (sexually transmitted infection)  testing, if you are at risk. Talk with your health care provider about your test results, treatment options, and if necessary, the need for more tests. Follow these instructions at home: Eating and drinking  Eat a healthy diet that includes fresh fruits and vegetables, whole grains, lean protein, and low-fat dairy products. Drink enough fluid to keep your urine pale yellow. Take vitamin and mineral supplements as recommended by your health care provider. Do not drink alcohol if your health care provider tells you not to drink. If you drink alcohol: Limit how much you have to 0-2 drinks a day. Know how much alcohol is in your drink. In the [place] one drink equals one 12 oz bottle of beer (355 mL), one 5 oz glass of wine (148 mL), or one 1 oz glass of hard liquor (44 mL). Lifestyle Brush your teeth every morning and night with fluoride toothpaste. Floss one time each day. Exercise for at least 30 minutes 5 or more days each week. Do not use any products that contain nicotine or tobacco. These products include cigarettes, chewing tobacco, and vaping devices, such as e-cigarettes. If you need help quitting, ask your health care provider. Do not use drugs. If you are sexually active, practice safe sex. Use a condom or other form of protection to prevent STIs. Find healthy ways to manage stress, such as: Meditation, yoga, or listening to music. Journaling. Talking to a trusted person. Spending time with friends and family. Minimize exposure to UV radiation to reduce your risk of skin cancer. Safety Always wear your seat belt while driving or riding in a vehicle. Do not drive: If you have been drinking alcohol. Do not ride with someone who has been drinking. If you have been using any mind-altering substances   or drugs. While texting. When you are tired or distracted. Wear a helmet and other protective equipment during sports activities. If you have firearms in your house, make sure you  follow all gun safety procedures. Seek help if you have been physically or sexually abused. What's next? Go to your health care provider once a year for an annual wellness visit. Ask your health care provider how often you should have your eyes and teeth checked. Stay up to date on all vaccines. This information is not intended to replace advice given to you by your health care provider. Make sure you discuss any questions you have with your health care provider. Document Revised: 01/27/2021 Document Reviewed: 01/27/2021 Elsevier Patient Education  2023 Elsevier Inc.  

## 2022-06-22 NOTE — Progress Notes (Signed)
Logan Mcguire - 31 y.o. male MRN [id number]  Date of birth: Dec 15, 1990  Subjective Chief Complaint  Patient presents with   Transitions Of Care    HPI Logan Mcguire is 30 y.o. male here today for annual exam.  He is former patient of Dr. Lyn Hollingshead.   He has been in good health.   He leads a very active.  He feels like his diet is quite good.  He does meal prepping over the weekend and follows this during the week.   He is a non-smoker.  EtoH intake on rare occasion.    He would like to have a flu vaccine today.   Review of Systems  Constitutional:  Negative for chills, fever, malaise/fatigue and weight loss.  HENT:  Negative for congestion, ear pain and sore throat.   Eyes:  Negative for blurred vision, double vision and pain.  Respiratory:  Negative for cough and shortness of breath.   Cardiovascular:  Negative for chest pain and palpitations.  Gastrointestinal:  Negative for abdominal pain, blood in stool, constipation, heartburn and nausea.  Genitourinary:  Negative for dysuria and urgency.  Musculoskeletal:  Negative for joint pain and myalgias.  Neurological:  Negative for dizziness and headaches.  Endo/Heme/Allergies:  Does not bruise/bleed easily.  Psychiatric/Behavioral:  Negative for depression. The patient is not nervous/anxious and does not have insomnia.     No Known Allergies  Past Medical History:  Diagnosis Date   History of high blood pressure    History of high cholesterol     History reviewed. No pertinent surgical history.  Social History   Socioeconomic History   Marital status: Married    Spouse name: Zubin Pontillo   Number of children: 0   Years of education: Not on file   Highest education level: Bachelor's degree (e.g., BA, AB, BS)  Occupational History   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   Tobacco Use   Smoking status: Never   Smokeless tobacco: Never  Vaping Use   Vaping Use: Never used  Substance and  Sexual Activity   Alcohol use: Yes    Alcohol/week: 0.0 standard drinks of alcohol    Comment: Socially   Drug use: No   Sexual activity: Yes    Partners: Female    Birth control/protection: None  Other Topics Concern   Not on file  Social History Narrative   Not on file   Social Determinants of Health   Financial Resource Strain: Not on file  Food Insecurity: Not on file  Transportation Needs: Not on file  Physical Activity: Unknown (09/11/2017)   Exercise Vital Sign    Days of Exercise per Week: 6 days    Minutes of Exercise per Session: Not on file  Stress: Not on file  Social Connections: Not on file    Family History  Problem Relation Age of Onset   Heart attack Maternal Grandmother    Diabetes Maternal Grandmother    High blood pressure Maternal Grandmother    High Cholesterol Maternal Grandmother    Heart attack Maternal Grandfather    Diabetes Maternal Grandfather    High blood pressure Maternal Grandfather    High Cholesterol Maternal Grandfather    Lung cancer Paternal Grandfather    Prostate cancer Paternal Grandfather     Health Maintenance  Topic Date Due   INFLUENZA VACCINE  03/15/2022   COVID-19 Vaccine (3 - Mixed Product series) 09/15/2022 (Originally 01/25/2020)   Hepatitis C Screening  06/23/2023 (  Originally 11/06/2008)   HIV Screening  06/23/2023 (Originally 11/06/2005)   TETANUS/TDAP  01/03/2028   HPV VACCINES  Aged Out     ----------------------------------------------------------------------------------------------------------------------------------------------------------------------------------------------------------------- Physical Exam BP 119/76 (BP Location: Left Arm, Patient Position: Sitting, Cuff Size: Normal)   Pulse 81   Ht 6\' 4"  (1.93 m)   Wt 218 lb 14.4 oz (99.3 kg)   SpO2 99%   BMI 26.65 kg/m   Physical Exam Constitutional:      General: He is not in acute distress. HENT:     Head: Normocephalic and atraumatic.      Right Ear: Tympanic membrane and external ear normal.     Left Ear: Tympanic membrane and external ear normal.  Eyes:     General: No scleral icterus. Neck:     Thyroid: No thyromegaly.  Cardiovascular:     Rate and Rhythm: Normal rate and regular rhythm.     Heart sounds: Normal heart sounds.  Pulmonary:     Effort: Pulmonary effort is normal.     Breath sounds: Normal breath sounds.  Abdominal:     General: Bowel sounds are normal. There is no distension.     Palpations: Abdomen is soft.     Tenderness: There is no abdominal tenderness. There is no guarding.  Musculoskeletal:     Cervical back: Normal range of motion.  Lymphadenopathy:     Cervical: No cervical adenopathy.  Skin:    General: Skin is warm and dry.     Findings: No rash.  Neurological:     Mental Status: He is alert and oriented to person, place, and time.     Cranial Nerves: No cranial nerve deficit.     Motor: No abnormal muscle tone.  Psychiatric:        Mood and Affect: Mood normal.        Behavior: Behavior normal.     ------------------------------------------------------------------------------------------------------------------------------------------------------------------------------------------------------------------- Assessment and Plan  Well adult exam Well adult Orders Placed This Encounter  Procedures   Flu Vaccine QUAD 6+ mos PF IM (Fluarix Quad PF)   COMPLETE METABOLIC PANEL WITH GFR   CBC with Differential   Lipid Panel w/reflex Direct LDL  Screenings: Per lab orders Immunizations: Flu vaccine given today.  Anticipatory guidance/Risk factor reduction:  Recommendations per AVS.    No orders of the defined types were placed in this encounter.   No follow-ups on file.    This visit occurred during the SARS-CoV-2 public health emergency.  Safety protocols were in place, including screening questions prior to the visit, additional usage of staff PPE, and extensive cleaning of  exam room while observing appropriate contact time as indicated for disinfecting solutions.

## 2022-06-23 LAB — CBC WITH DIFFERENTIAL/PLATELET
Absolute Monocytes: 426 cells/uL (ref 200–950)
Basophils Absolute: 53 cells/uL (ref 0–200)
Basophils Relative: 0.7 %
Eosinophils Absolute: 91 cells/uL (ref 15–500)
Eosinophils Relative: 1.2 %
HCT: 44 % (ref 38.5–50.0)
Hemoglobin: 15 g/dL (ref 13.2–17.1)
Lymphs Abs: 2265 cells/uL (ref 850–3900)
MCH: 29.4 pg (ref 27.0–33.0)
MCHC: 34.1 g/dL (ref 32.0–36.0)
MCV: 86.1 fL (ref 80.0–100.0)
MPV: 10.6 fL (ref 7.5–12.5)
Monocytes Relative: 5.6 %
Neutro Abs: 4765 cells/uL (ref 1500–7800)
Neutrophils Relative %: 62.7 %
Platelets: 258 10*3/uL (ref 140–400)
RBC: 5.11 10*6/uL (ref 4.20–5.80)
RDW: 12.4 % (ref 11.0–15.0)
Total Lymphocyte: 29.8 %
WBC: 7.6 10*3/uL (ref 3.8–10.8)

## 2022-06-23 LAB — COMPLETE METABOLIC PANEL WITH GFR
AG Ratio: 1.9 (calc) (ref 1.0–2.5)
ALT: 16 U/L (ref 9–46)
AST: 19 U/L (ref 10–40)
Albumin: 4.6 g/dL (ref 3.6–5.1)
Alkaline phosphatase (APISO): 70 U/L (ref 36–130)
BUN: 14 mg/dL (ref 7–25)
CO2: 29 mmol/L (ref 20–32)
Calcium: 9.4 mg/dL (ref 8.6–10.3)
Chloride: 101 mmol/L (ref 98–110)
Creat: 1.21 mg/dL (ref 0.60–1.26)
Globulin: 2.4 g/dL (calc) (ref 1.9–3.7)
Glucose, Bld: 90 mg/dL (ref 65–99)
Potassium: 4 mmol/L (ref 3.5–5.3)
Sodium: 139 mmol/L (ref 135–146)
Total Bilirubin: 0.7 mg/dL (ref 0.2–1.2)
Total Protein: 7 g/dL (ref 6.1–8.1)
eGFR: 82 mL/min/{1.73_m2} (ref >=60)

## 2022-06-23 LAB — LIPID PANEL W/REFLEX DIRECT LDL
Cholesterol: 220 mg/dL — ABNORMAL HIGH (ref <200)
HDL: 61 mg/dL (ref >=40)
LDL Cholesterol (Calc): 140 mg/dL (calc) — ABNORMAL HIGH
Non-HDL Cholesterol (Calc): 159 mg/dL (calc) — ABNORMAL HIGH (ref <130)
Total CHOL/HDL Ratio: 3.6 (calc) (ref <5.0)
Triglycerides: 88 mg/dL (ref <150)

## 2023-06-26 ENCOUNTER — Encounter: Payer: BC Managed Care – PPO | Admitting: Family Medicine

## 2023-07-04 ENCOUNTER — Telehealth: Payer: Self-pay | Admitting: Family Medicine

## 2023-07-04 NOTE — Telephone Encounter (Signed)
error 

## 2023-07-06 ENCOUNTER — Encounter: Payer: BC Managed Care – PPO | Admitting: Family Medicine

## 2023-07-10 ENCOUNTER — Encounter: Payer: BC Managed Care – PPO | Admitting: Family Medicine

## 2023-07-19 ENCOUNTER — Encounter: Payer: Self-pay | Admitting: Family Medicine

## 2023-07-19 ENCOUNTER — Ambulatory Visit (INDEPENDENT_AMBULATORY_CARE_PROVIDER_SITE_OTHER): Payer: 59 | Admitting: Family Medicine

## 2023-07-19 VITALS — BP 142/70 | HR 92 | Ht 76.0 in | Wt 226.0 lb

## 2023-07-19 DIAGNOSIS — Z23 Encounter for immunization: Secondary | ICD-10-CM

## 2023-07-19 DIAGNOSIS — Z0000 Encounter for general adult medical examination without abnormal findings: Secondary | ICD-10-CM | POA: Diagnosis not present

## 2023-07-19 NOTE — Patient Instructions (Signed)
Preventive Care 21-32 Years Old, Male Preventive care refers to lifestyle choices and visits with your health care provider that can promote health and wellness. Preventive care visits are also called wellness exams. What can I expect for my preventive care visit? Counseling During your preventive care visit, your health care provider may ask about your: Medical history, including: Past medical problems. Family medical history. Current health, including: Emotional well-being. Home life and relationship well-being. Sexual activity. Lifestyle, including: Alcohol, nicotine or tobacco, and drug use. Access to firearms. Diet, exercise, and sleep habits. Safety issues such as seatbelt and bike helmet use. Sunscreen use. Work and work environment. Physical exam Your health care provider may check your: Height and weight. These may be used to calculate your BMI (body mass index). BMI is a measurement that tells if you are at a healthy weight. Waist circumference. This measures the distance around your waistline. This measurement also tells if you are at a healthy weight and may help predict your risk of certain diseases, such as type 2 diabetes and high blood pressure. Heart rate and blood pressure. Body temperature. Skin for abnormal spots. What immunizations do I need?  Vaccines are usually given at various ages, according to a schedule. Your health care provider will recommend vaccines for you based on your age, medical history, and lifestyle or other factors, such as travel or where you work. What tests do I need? Screening Your health care provider may recommend screening tests for certain conditions. This may include: Lipid and cholesterol levels. Diabetes screening. This is done by checking your blood sugar (glucose) after you have not eaten for a while (fasting). Hepatitis B test. Hepatitis C test. HIV (human immunodeficiency virus) test. STI (sexually transmitted infection)  testing, if you are at risk. Talk with your health care provider about your test results, treatment options, and if necessary, the need for more tests. Follow these instructions at home: Eating and drinking  Eat a healthy diet that includes fresh fruits and vegetables, whole grains, lean protein, and low-fat dairy products. Drink enough fluid to keep your urine pale yellow. Take vitamin and mineral supplements as recommended by your health care provider. Do not drink alcohol if your health care provider tells you not to drink. If you drink alcohol: Limit how much you have to 0-2 drinks a day. Know how much alcohol is in your drink. In the [place] one drink equals one 12 oz bottle of beer (355 mL), one 5 oz glass of wine (148 mL), or one 1 oz glass of hard liquor (44 mL). Lifestyle Brush your teeth every morning and night with fluoride toothpaste. Floss one time each day. Exercise for at least 30 minutes 5 or more days each week. Do not use any products that contain nicotine or tobacco. These products include cigarettes, chewing tobacco, and vaping devices, such as e-cigarettes. If you need help quitting, ask your health care provider. Do not use drugs. If you are sexually active, practice safe sex. Use a condom or other form of protection to prevent STIs. Find healthy ways to manage stress, such as: Meditation, yoga, or listening to music. Journaling. Talking to a trusted person. Spending time with friends and family. Minimize exposure to UV radiation to reduce your risk of skin cancer. Safety Always wear your seat belt while driving or riding in a vehicle. Do not drive: If you have been drinking alcohol. Do not ride with someone who has been drinking. If you have been using any mind-altering substances   or drugs. While texting. When you are tired or distracted. Wear a helmet and other protective equipment during sports activities. If you have firearms in your house, make sure you  follow all gun safety procedures. Seek help if you have been physically or sexually abused. What's next? Go to your health care provider once a year for an annual wellness visit. Ask your health care provider how often you should have your eyes and teeth checked. Stay up to date on all vaccines. This information is not intended to replace advice given to you by your health care provider. Make sure you discuss any questions you have with your health care provider. Document Revised: 01/27/2021 Document Reviewed: 01/27/2021 Elsevier Patient Education  2024 [employer] Inc[employer]  

## 2023-07-19 NOTE — Progress Notes (Signed)
Logan Mcguire - 32 y.o. male MRN [id number]  Date of birth: Jul 19, 1991  Subjective Chief Complaint  Patient presents with   Annual Exam    HPI Logan Mcguire is a 32 y.o. male here today for annual exam.   He reports that he is doing well   Remains fairly active.  He feels that his diet is pretty good.  He has a toddler and 93 week old at home that keeps him pretty active.   He is a non-smoker. Occasional EtOH consumption.   Review of Systems  Constitutional:  Negative for chills, fever, malaise/fatigue and weight loss.  HENT:  Negative for congestion, ear pain and sore throat.   Eyes:  Negative for blurred vision, double vision and pain.  Respiratory:  Negative for cough and shortness of breath.   Cardiovascular:  Negative for chest pain and palpitations.  Gastrointestinal:  Negative for abdominal pain, blood in stool, constipation, heartburn and nausea.  Genitourinary:  Negative for dysuria and urgency.  Musculoskeletal:  Negative for joint pain and myalgias.  Neurological:  Negative for dizziness and headaches.  Endo/Heme/Allergies:  Does not bruise/bleed easily.  Psychiatric/Behavioral:  Negative for depression. The patient is not nervous/anxious and does not have insomnia.     No Known Allergies  Past Medical History:  Diagnosis Date   History of high blood pressure    History of high cholesterol     No past surgical history on file.  Social History   Socioeconomic History   Marital status: Married    Spouse name: Kalep Summerton   Number of children: 0   Years of education: Not on file   Highest education level: Bachelor's degree (e.g., BA, AB, BS)  Occupational History   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   Tobacco Use   Smoking status: Never   Smokeless tobacco: Never  Vaping Use   Vaping status: Never Used  Substance and Sexual Activity   Alcohol use: Yes    Alcohol/week: 0.0 standard drinks of alcohol    Comment: Socially    Drug use: No   Sexual activity: Yes    Partners: Female    Birth control/protection: None  Other Topics Concern   Not on file  Social History Narrative   Not on file   Social Determinants of Health   Financial Resource Strain: Low Risk  (07/18/2023)   Overall Financial Resource Strain (CARDIA)    Difficulty of Paying Living Expenses: Not hard at all  Food Insecurity: No Food Insecurity (07/18/2023)   Hunger Vital Sign    Worried About Running Out of Food in the Last Year: Never true    Ran Out of Food in the Last Year: Never true  Transportation Needs: No Transportation Needs (07/18/2023)   PRAPARE - Administrator, Civil Service (Medical): No    Lack of Transportation (Non-Medical): No  Physical Activity: Sufficiently Active (07/18/2023)   Exercise Vital Sign    Days of Exercise per Week: 5 days    Minutes of Exercise per Session: 60 min  Stress: No Stress Concern Present (07/18/2023)   [employer] of Occupational Health - Occupational Stress Questionnaire    Feeling of Stress : Only a little  Social Connections: Moderately Isolated (07/18/2023)   Social Connection and Isolation Panel [NHANES]    Frequency of Communication with Friends and Family: More than three times a week    Frequency of Social Gatherings with Friends and Family: Twice a week  Attends Religious Services: Never    Active Member of Clubs or Organizations: No    Attends Engineer, structural: Not on file    Marital Status: Married    Family History  Problem Relation Age of Onset   Heart attack Maternal Grandmother    Diabetes Maternal Grandmother    High blood pressure Maternal Grandmother    High Cholesterol Maternal Grandmother    Heart attack Maternal Grandfather    Diabetes Maternal Grandfather    High blood pressure Maternal Grandfather    High Cholesterol Maternal Grandfather    Lung cancer Paternal Grandfather    Prostate cancer Paternal Grandfather     Health  Maintenance  Topic Date Due   HIV Screening  Never done   Hepatitis C Screening  Never done   INFLUENZA VACCINE  03/16/2023   COVID-19 Vaccine (3 - 2023-24 season) 04/16/2023   DTaP/Tdap/Td (2 - Td or Tdap) 01/03/2028   HPV VACCINES  Aged Out     ----------------------------------------------------------------------------------------------------------------------------------------------------------------------------------------------------------------- Physical Exam BP (!) 142/79 (BP Location: Left Arm, Patient Position: Sitting, Cuff Size: Large)   Pulse 92   Ht 6\' 4"  (1.93 m)   Wt 226 lb (102.5 kg)   SpO2 100%   BMI 27.51 kg/m   Physical Exam Constitutional:      General: He is not in acute distress. HENT:     Head: Normocephalic and atraumatic.     Right Ear: Tympanic membrane and external ear normal.     Left Ear: Tympanic membrane and external ear normal.  Eyes:     General: No scleral icterus. Neck:     Thyroid: No thyromegaly.  Cardiovascular:     Rate and Rhythm: Normal rate and regular rhythm.     Heart sounds: Normal heart sounds.  Pulmonary:     Effort: Pulmonary effort is normal.     Breath sounds: Normal breath sounds.  Abdominal:     General: Bowel sounds are normal. There is no distension.     Palpations: Abdomen is soft.     Tenderness: There is no abdominal tenderness. There is no guarding.  Musculoskeletal:     Cervical back: Normal range of motion.  Lymphadenopathy:     Cervical: No cervical adenopathy.  Skin:    General: Skin is warm and dry.     Findings: No rash.  Neurological:     Mental Status: He is alert and oriented to person, place, and time.     Cranial Nerves: No cranial nerve deficit.     Motor: No abnormal muscle tone.  Psychiatric:        Mood and Affect: Mood normal.        Behavior: Behavior normal.      ------------------------------------------------------------------------------------------------------------------------------------------------------------------------------------------------------------------- Assessment and Plan  Well adult exam Well adult Orders Placed This Encounter  Procedures   CMP14+EGFR   CBC with Differential/Platelet   Lipid Panel With LDL/HDL Ratio  Screenings: Per lab orders Immunizations: Flu vaccine given today.  Anticipatory guidance/Risk factor reduction:  Recommendations per AVS.    No orders of the defined types were placed in this encounter.   No follow-ups on file.    This visit occurred during the SARS-CoV-2 public health emergency.  Safety protocols were in place, including screening questions prior to the visit, additional usage of staff PPE, and extensive cleaning of exam room while observing appropriate contact time as indicated for disinfecting solutions.

## 2023-07-19 NOTE — Assessment & Plan Note (Signed)
Well adult Orders Placed This Encounter  Procedures   CMP14+EGFR   CBC with Differential/Platelet   Lipid Panel With LDL/HDL Ratio  Screenings: Per lab orders Immunizations: Flu vaccine given today.  Anticipatory guidance/Risk factor reduction:  Recommendations per AVS.

## 2023-07-20 ENCOUNTER — Encounter: Payer: Self-pay | Admitting: Family Medicine

## 2023-07-20 LAB — CBC WITH DIFFERENTIAL/PLATELET
Basophils Absolute: 0.1 10*3/uL (ref 0.0–0.2)
Basos: 1 %
EOS (ABSOLUTE): 0.1 10*3/uL (ref 0.0–0.4)
Eos: 3 %
Hematocrit: 46.8 % (ref 37.5–51.0)
Hemoglobin: 16.1 g/dL (ref 13.0–17.7)
Immature Grans (Abs): 0 10*3/uL (ref 0.0–0.1)
Immature Granulocytes: 0 %
Lymphocytes Absolute: 2.1 10*3/uL (ref 0.7–3.1)
Lymphs: 38 %
MCH: 30.1 pg (ref 26.6–33.0)
MCHC: 34.4 g/dL (ref 31.5–35.7)
MCV: 88 fL (ref 79–97)
Monocytes Absolute: 0.5 10*3/uL (ref 0.1–0.9)
Monocytes: 9 %
Neutrophils Absolute: 2.7 10*3/uL (ref 1.4–7.0)
Neutrophils: 49 %
Platelets: 233 10*3/uL (ref 150–450)
RBC: 5.34 x10E6/uL (ref 4.14–5.80)
RDW: 12 % (ref 11.6–15.4)
WBC: 5.4 10*3/uL (ref 3.4–10.8)

## 2023-07-20 LAB — CMP14+EGFR
ALT: 21 [IU]/L (ref 0–44)
AST: 21 [IU]/L (ref 0–40)
Albumin: 4.7 g/dL (ref 4.1–5.1)
Alkaline Phosphatase: 70 [IU]/L (ref 44–121)
BUN/Creatinine Ratio: 13 (ref 9–20)
BUN: 15 mg/dL (ref 6–20)
Bilirubin Total: 0.8 mg/dL (ref 0.0–1.2)
CO2: 24 mmol/L (ref 20–29)
Calcium: 9.5 mg/dL (ref 8.7–10.2)
Chloride: 102 mmol/L (ref 96–106)
Creatinine, Ser: 1.16 mg/dL (ref 0.76–1.27)
Globulin, Total: 2.2 g/dL (ref 1.5–4.5)
Glucose: 107 mg/dL — ABNORMAL HIGH (ref 70–99)
Potassium: 4.5 mmol/L (ref 3.5–5.2)
Sodium: 140 mmol/L (ref 134–144)
Total Protein: 6.9 g/dL (ref 6.0–8.5)
eGFR: 86 mL/min/{1.73_m2} (ref >59)

## 2023-07-20 LAB — LIPID PANEL WITH LDL/HDL RATIO
Cholesterol, Total: 270 mg/dL — ABNORMAL HIGH (ref 100–199)
HDL: 71 mg/dL (ref >39)
LDL Chol Calc (NIH): 186 mg/dL — ABNORMAL HIGH (ref 0–99)
LDL/HDL Ratio: 2.6 {ratio} (ref 0.0–3.6)
Triglycerides: 80 mg/dL (ref 0–149)
VLDL Cholesterol Cal: 13 mg/dL (ref 5–40)

## 2023-07-24 ENCOUNTER — Other Ambulatory Visit: Payer: Self-pay | Admitting: Family Medicine

## 2023-07-24 DIAGNOSIS — E785 Hyperlipidemia, unspecified: Secondary | ICD-10-CM

## 2023-08-04 LAB — NMR, LIPOPROFILE
Cholesterol, Total: 220 mg/dL — ABNORMAL HIGH (ref 100–199)
HDL Particle Number: 38.7 umol/L (ref >=30.5)
HDL-C: 65 mg/dL (ref >39)
LDL Particle Number: 1291 nmol/L — ABNORMAL HIGH (ref <1000)
LDL Size: 21.8 nmol (ref >20.5)
LDL-C (NIH Calc): 137 mg/dL — ABNORMAL HIGH (ref 0–99)
LP-IR Score: <25 (ref <=45)
Small LDL Particle Number: 293 nmol/L (ref <=527)
Triglycerides: 100 mg/dL (ref 0–149)

## 2023-08-04 LAB — APOLIPOPROTEIN B: Apolipoprotein B: 102 mg/dL — ABNORMAL HIGH (ref <90)

## 2024-07-19 ENCOUNTER — Encounter: Payer: 59 | Admitting: Family Medicine
